# Patient Record
Sex: Female | Born: 1965 | Race: Black or African American | Hispanic: No | Smoking: Never smoker
Health system: Southern US, Community
[De-identification: ages and names within clinical notes are randomized; demographics above are authoritative.]

## PROBLEM LIST (undated history)

## (undated) DIAGNOSIS — R2681 Unsteadiness on feet: Secondary | ICD-10-CM

## (undated) DIAGNOSIS — F419 Anxiety disorder, unspecified: Secondary | ICD-10-CM

## (undated) DIAGNOSIS — Z8619 Personal history of other infectious and parasitic diseases: Secondary | ICD-10-CM

## (undated) DIAGNOSIS — E0590 Thyrotoxicosis, unspecified without thyrotoxic crisis or storm: Secondary | ICD-10-CM

## (undated) DIAGNOSIS — R002 Palpitations: Secondary | ICD-10-CM

## (undated) DIAGNOSIS — K219 Gastro-esophageal reflux disease without esophagitis: Secondary | ICD-10-CM

## (undated) DIAGNOSIS — I83819 Varicose veins of unspecified lower extremities with pain: Secondary | ICD-10-CM

## (undated) HISTORY — DX: Thyrotoxicosis, unspecified without thyrotoxic crisis or storm: E05.90

## (undated) HISTORY — DX: Varicose veins of unspecified lower extremity with pain: I83.819

## (undated) HISTORY — DX: Personal history of other infectious and parasitic diseases: Z86.19

## (undated) HISTORY — DX: Palpitations: R00.2

## (undated) HISTORY — DX: Anxiety disorder, unspecified: F41.9

## (undated) HISTORY — PX: NO PAST SURGERIES: SHX2092

## (undated) HISTORY — DX: Unsteadiness on feet: R26.81

## (undated) HISTORY — DX: Gastro-esophageal reflux disease without esophagitis: K21.9

---

## 2013-03-04 ENCOUNTER — Encounter: Payer: Self-pay | Admitting: Obstetrics and Gynecology

## 2013-03-24 ENCOUNTER — Encounter: Payer: Self-pay | Admitting: Obstetrics and Gynecology

## 2013-04-05 ENCOUNTER — Encounter: Payer: Self-pay | Admitting: Obstetrics and Gynecology

## 2014-04-01 DIAGNOSIS — I83819 Varicose veins of unspecified lower extremities with pain: Secondary | ICD-10-CM

## 2014-04-01 HISTORY — DX: Varicose veins of unspecified lower extremity with pain: I83.819

## 2014-04-22 DIAGNOSIS — L84 Corns and callosities: Secondary | ICD-10-CM | POA: Insufficient documentation

## 2015-09-27 DIAGNOSIS — K219 Gastro-esophageal reflux disease without esophagitis: Secondary | ICD-10-CM

## 2015-09-27 HISTORY — DX: Gastro-esophageal reflux disease without esophagitis: K21.9

## 2017-11-12 DIAGNOSIS — H2513 Age-related nuclear cataract, bilateral: Secondary | ICD-10-CM | POA: Diagnosis not present

## 2017-11-12 DIAGNOSIS — H40033 Anatomical narrow angle, bilateral: Secondary | ICD-10-CM | POA: Diagnosis not present

## 2017-12-30 DIAGNOSIS — R2681 Unsteadiness on feet: Secondary | ICD-10-CM | POA: Insufficient documentation

## 2017-12-30 DIAGNOSIS — R42 Dizziness and giddiness: Secondary | ICD-10-CM | POA: Diagnosis not present

## 2018-01-15 ENCOUNTER — Ambulatory Visit: Payer: Medicaid Other | Attending: Family Medicine | Admitting: Rehabilitative and Restorative Service Providers"

## 2018-01-15 ENCOUNTER — Encounter: Payer: Self-pay | Admitting: Rehabilitative and Restorative Service Providers"

## 2018-01-15 DIAGNOSIS — R2689 Other abnormalities of gait and mobility: Secondary | ICD-10-CM | POA: Diagnosis present

## 2018-01-15 DIAGNOSIS — R42 Dizziness and giddiness: Secondary | ICD-10-CM | POA: Insufficient documentation

## 2018-01-15 DIAGNOSIS — H8111 Benign paroxysmal vertigo, right ear: Secondary | ICD-10-CM | POA: Diagnosis present

## 2018-01-15 NOTE — Therapy (Signed)
[facility] [address] [place], [place], [address] Phone: [phone]   Fax:  [phone]  Physical Therapy Evaluation  Patient Details  Name: Melinda Charles MRN: [id number] Date of Birth: 03/12/1956 Referring Provider: Virl Son, MD   Encounter Date: 01/15/2018  PT End of Session - 01/15/18 1527    Visit Number  1    Number of Visits  4 eval + 3 visits    Date for PT Re-Evaluation  02/22/18    Authorization Type  Medicaid- PT to submit for 3 visits over next month (1x/week) 6/6-6/30/19    PT Start Time  1020    PT Stop Time  1100    PT Time Calculation (min)  40 min    Activity Tolerance  Patient tolerated treatment well    Behavior During Therapy  [facility] for tasks assessed/performed       History reviewed. No pertinent past medical history.  History reviewed. No pertinent surgical history.  There were no vitals filed for this visit.   Subjective Assessment - 01/15/18 1024    Subjective  The patient notes a correlation in onset of symptoms between getting new glasses and dizziness, approximately 1 month ago.  She notes she got glasses and then developed a room spinning sensation with right rolling.  Symptoms have improved because she is no longer wearing the glasses, however is "not back to normal".  She notes she has never has these issues before.   She notes she sometimes gets a neck spasm with dizziness.  She also notes head pain in the posterior aspect of her head.     Pertinent History  Denies h/o migraines.      Patient Stated Goals  "I'm trying to get rid of the imbalance, lightheadedness".  Some days are better than others.    Currently in Pain?  Yes    Pain Score  -- "tension", mild in head/neck    Pain Location  Neck    Pain Descriptors / Indicators  -- tension    Pain Type  Acute pain    Pain Onset  More than a month ago    Pain Frequency  Intermittent         [facility] PT Assessment - 01/15/18 1028       Assessment   Medical Diagnosis  BPPV, right ear    Referring Provider  Virl Son, MD    Onset Date/Surgical Date  -- order date=01/08/18, began approximately 1 month ago    Prior Therapy  none      Precautions   Precautions  None      Restrictions   Weight Bearing Restrictions  No      Balance Screen   Has the patient fallen in the past 6 months  No    Has the patient had a decrease in activity level because of a fear of falling?   No    Is the patient reluctant to leave their home because of a fear of falling?   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           Vestibular Assessment - 01/15/18 1030      Vestibular Assessment   General Observation  Patient walks into clinic independently without device.      Symptom Behavior   Type of Dizziness  Imbalance lightheadedness    Frequency of Dizziness  daily    Duration of Dizziness  depends on movement    Aggravating Factors  Activity in general    Relieving Factors  Head stationary      Occulomotor Exam   Occulomotor Alignment  Normal    Spontaneous  Absent    Gaze-induced  Absent    Smooth Pursuits  Intact    Saccades  Intact      Vestibulo-Occular Reflex   VOR 1 Head Only (x 1 viewing)  Head impulse test=intact bilaterally    VOR to Slow Head Movement  -- feels "whoozy" sensation x 5 reps     Comment  "general whoozy feeling"       Visual Acuity   Static  line 5 mistakes "F" for "R" in line 8,7,6    Dynamic  line 2 3 line difference indicates VOR abnormality      Positional Testing   Dix-Hallpike  Dix-Hallpike Right;Dix-Hallpike Left    Sidelying Test  Sidelying Right;Sidelying Left    Horizontal Canal Testing  Horizontal Canal Right;Horizontal Canal Left      Dix-Hallpike Right   Dix-Hallpike Right Duration  trace sensation of dizziness described as "imbalance" more than spinning    Dix-Hallpike Right Symptoms  No nystagmus viewed in room light      Dix-Hallpike Left   Dix-Hallpike Left Duration  mild sensation of dizziness    Dix-Hallpike Left Symptoms  No nystagmus      Sidelying Right   Sidelying Right Duration  none upon R sidelying, "imbalance" senation with return to sitting    Sidelying Right Symptoms  No nystagmus      Sidelying Left   Sidelying Left Duration  none    Sidelying Left Symptoms  No nystagmus      Horizontal Canal Right   Horizontal Canal Right Duration  none    Horizontal Canal Right Symptoms  Normal      Horizontal Canal Left   Horizontal Canal Left Duration  none    Horizontal Canal Left Symptoms  Normal      Positional Sensitivities   Sit to Supine  Lightheadedness    Supine to Left Side  No dizziness    Supine to Right Side  No dizziness    Supine to Sitting  Moderate dizziness    Right Hallpike  Lightheadedness    Up from Right Hallpike  Mild dizziness    Up from Left Hallpike  Moderate dizziness    Head Turning x 5  No dizziness    Head Nodding x 5  Lightheadedness    Rolling Right  No dizziness    Rolling Left  No dizziness    Positional Sensitivities Comments  *patient notes a mild headache after testing          Objective measurements  completed on examination: See above findings.       Vestibular Treatment/Exercise - 01/15/18 1049      Vestibular Treatment/Exercise   Vestibular Treatment Provided  Habituation     Habituation Exercises  Francee Piccolo Daroff;Horizontal Roll    Gaze Exercises  X1 Viewing Horizontal      Austin Miles   Number of Reps   1    Symptom Description   discussed performing for HEP until 2 days without symptoms.      Horizontal Roll   Number of Reps   3      X1 Viewing Horizontal   Foot Position  standing feet apart            PT Education - 01/15/18 1526    Education provided  Yes    Education Details  HEP: gaze x 1 viewing, habituation (rolling and brandt daroff)    Person(s) Educated  Patient    Methods  Explanation;Demonstration;Handout    Comprehension  Returned demonstration;Verbalized understanding          PT Long Term Goals - 01/15/18 1532      PT LONG TERM GOAL #1   Title  The patient will be indep with HEP for motion sensitivity, gaze x 1 adaptation, and high level balance.    Baseline  No current HEP.    Time  4    Period  Weeks    Target Date  02/14/18      PT LONG TERM GOAL #2   Title  The patient will improve SVA versus DVA to 2 line difference.    Baseline  3 line difference at baseline    Time  4    Period  Weeks    Target Date  02/14/18      PT LONG TERM GOAL #3   Title  The patient will subjectively report no dizziness with rolling in bed in morning or with quick transitional movements.    Baseline  Notes intermittent dizziness and rolling with bed mobility rated "moderate"    Time  4    Period  Weeks    Target Date  02/14/18      PT LONG TERM GOAL #4   Title  The patient will subjectively report episodes of neck spasms resolved as tolerance to overall movement improves.    Baseline  Notes neck spasms and has en bloc/guarded movement    Time  4    Period  Weeks    Target Date  02/14/18             Plan - 01/15/18 1534    Clinical Impression Statement  The patient is a 51 year old female presenting to OP physical therapy after an onset of room spinning vertigo 1 month ago with right rolling.  She feels this initial  episode has lessened, however she continues with general imbalance, dizziness and guarded movement.  This has led to muscle spasms in her neck and a general decline in household activities.  PT to address impairments in gaze adaptation, balance, motion sensitivity to optimize current functional status.    History and Personal Factors relevant to plan of care:  No other medical history, recent change in lenses    Clinical Presentation  Stable    Clinical Presentation due to:  improving symptoms since onset, general dizziness and imbalance remaining    Clinical Decision Making  Low    Rehab Potential  Good    PT Frequency  1x / week eval +  PT Duration  3 weeks    PT Treatment/Interventions  ADLs/Self Care Home Management;Balance training;Neuromuscular re-education;Patient/family education;Functional mobility training;Gait training;Stair training;Therapeutic activities;Therapeutic exercise;Manual techniques;Vestibular;Canalith Repostioning    PT Next Visit Plan  PT established HEP (no treatment charge today due to [facility]).  Check HEP, progress by adding high level balance, dynamic gait, progressing gaze and habituation    Consulted and Agree with Plan of Care  Patient       Patient will benefit from skilled therapeutic intervention in order to improve the following deficits and impairments:  Abnormal gait, Decreased activity tolerance, Dizziness, Increased muscle spasms, Decreased balance, Pain  Visit Diagnosis: Dizziness and giddiness  BPPV (benign paroxysmal positional vertigo), right  Other abnormalities of gait and mobility     Problem List There are no active problems to display for this patient.   Americo Vallery, PT 01/15/2018, 3:38 PM  [facility] [address] [place], [place], [address] Phone: [phone]   Fax:  [phone]  Name: Shantaya Bluestone MRN: [id number] Date of Birth: 03/12/1956

## 2018-01-15 NOTE — Patient Instructions (Signed)
Habituation - Tip Card  1.The goal of habituation training is to assist in decreasing symptoms of vertigo, dizziness, or nausea provoked by specific head and body motions. 2.These exercises may initially increase symptoms; however, be persistent and work through symptoms. With repetition and time, the exercises will assist in reducing or eliminating symptoms. 3.Exercises should be stopped and discussed with the therapist if you experience any of the following: - Sudden change or fluctuation in hearing - New onset of ringing in the ears, or increase in current intensity - Any fluid discharge from the ear - Severe pain in neck or back - Extreme nausea  Copyright  VHI. All rights reserved.   Habituation - Rolling   With pillow under head, start on back. Roll to your right side.  Hold until dizziness stops, plus 20 seconds and then roll to the left side.  Hold until dizziness stops, plus 20 seconds.  Repeat sequence 5 times per session. Do 2 sessions per day.  Copyright  VHI. All rights reserved.          Habituation - Sit to Side-Lying   Sit on edge of bed. Lie down onto the right side and hold until dizziness stops, plus 20 seconds.  Return to sitting and wait until dizziness stops, plus 20 seconds.  Repeat to the left side. Repeat sequence 5 times per session. Do 2 sessions per day.  Copyright  VHI. All rights reserved.    Gaze Stabilization - Tip Card  1.Target must remain in focus, not blurry, and appear stationary while head is in motion. 2.Perform exercises with small head movements (45 to either side of midline). 3.Increase speed of head motion so long as target is in focus. 4.If you wear eyeglasses, be sure you can see target through lens (therapist will give specific instructions for bifocal / progressive lenses). 5.These exercises may provoke dizziness or nausea. Work through these symptoms. If too dizzy, slow head movement slightly. Rest between each  exercise. 6.Exercises demand concentration; avoid distractions. 7.For safety, perform standing exercises close to a counter, wall, corner, or next to someone.  Copyright  VHI. All rights reserved.   Gaze Stabilization - Standing Feet Apart   Feet shoulder width apart, keeping eyes on target on wall 3 feet away, tilt head down slightly and move head side to side for 30 seconds. Repeat while moving head up and down for 30 seconds. *Work up to tolerating 60 seconds, as able. Do 2-3 sessions per day.   Copyright  VHI. All rights reserved.    

## 2018-01-18 ENCOUNTER — Emergency Department (HOSPITAL_COMMUNITY)
Admission: EM | Admit: 2018-01-18 | Discharge: 2018-01-18 | Disposition: A | Discharge status: Home / Self Care | Payer: Medicaid Other | Attending: Emergency Medicine | Admitting: Emergency Medicine

## 2018-01-18 ENCOUNTER — Other Ambulatory Visit: Payer: Self-pay

## 2018-01-18 ENCOUNTER — Emergency Department (HOSPITAL_COMMUNITY): Payer: Medicaid Other

## 2018-01-18 ENCOUNTER — Encounter (HOSPITAL_COMMUNITY): Payer: Self-pay | Admitting: Emergency Medicine

## 2018-01-18 DIAGNOSIS — E876 Hypokalemia: Secondary | ICD-10-CM | POA: Insufficient documentation

## 2018-01-18 DIAGNOSIS — R002 Palpitations: Secondary | ICD-10-CM | POA: Insufficient documentation

## 2018-01-18 LAB — I-STAT BETA HCG BLOOD, ED (MC, WL, AP ONLY)

## 2018-01-18 LAB — CBC
HEMATOCRIT: 37.3 % (ref 36.0–46.0)
HEMOGLOBIN: 12.5 g/dL (ref 12.0–15.0)
MCH: 32.3 pg (ref 26.0–34.0)
MCHC: 33.5 g/dL (ref 30.0–36.0)
MCV: 96.4 fL (ref 78.0–100.0)
Platelets: 289 10*3/uL (ref 150–400)
RBC: 3.87 MIL/uL (ref 3.87–5.11)
RDW: 12.7 % (ref 11.5–15.5)
WBC: 5.6 10*3/uL (ref 4.0–10.5)

## 2018-01-18 LAB — I-STAT TROPONIN, ED: Troponin i, poc: 0 ng/mL (ref 0.00–0.08)

## 2018-01-18 LAB — BASIC METABOLIC PANEL
ANION GAP: 11 (ref 5–15)
BUN: 12 mg/dL (ref 6–20)
CHLORIDE: 106 mmol/L (ref 101–111)
CO2: 22 mmol/L (ref 22–32)
Calcium: 9.3 mg/dL (ref 8.9–10.3)
Creatinine, Ser: 0.72 mg/dL (ref 0.44–1.00)
GFR calc Af Amer: >60 mL/min (ref >60)
Glucose, Bld: 109 mg/dL — ABNORMAL HIGH (ref 65–99)
POTASSIUM: 3.4 mmol/L — AB (ref 3.5–5.1)
SODIUM: 139 mmol/L (ref 135–145)

## 2018-01-18 MED ORDER — POTASSIUM CHLORIDE CRYS ER 20 MEQ PO TBCR
40.0000 meq | EXTENDED_RELEASE_TABLET | Freq: Once | ORAL | Status: AC
  Administered 2018-01-18: 40 meq via ORAL
  Filled 2018-01-18: qty 2

## 2018-01-18 MED ORDER — POTASSIUM CHLORIDE CRYS ER 20 MEQ PO TBCR: 20.0000 meq | EXTENDED_RELEASE_TABLET | Freq: Two times a day (BID) | ORAL | 0 refills | Status: DC

## 2018-01-18 NOTE — ED Triage Notes (Signed)
Pt reports having palpitations that started tonight and woke her from sleep. Pt reports feeling as if insides were shaking. Pt dx with vertigo several weeks ago. Pt reports having pain in left arm, shoulder, and armpit that started today.

## 2018-01-18 NOTE — Discharge Instructions (Addendum)
Please make a follow-up appointment with the cardiologist.  Return to the emergency department if you are having worsening of your symptoms.

## 2018-01-18 NOTE — ED Notes (Signed)
ED Provider at bedside. 

## 2018-01-18 NOTE — ED Provider Notes (Signed)
[facility]-EMERGENCY DEPT Provider Note   [name]: [id number] Arrival date & time: 01/18/18  0326     History   Chief Complaint Chief Complaint  Patient presents with  . Palpitations    HPI Melinda Charles is a 52 y.o. female.  The history is provided by the patient.  She had an episode this evening of her heart racing.  And lasted about 1-2 minutes and then resolved.  No associated chest pain, dyspnea, nausea, diaphoresis.  She states that she has been having several things going on over the left past few weeks.  She had seen her PCP for an episode of vertigo.  She was then referred to ENT who diagnosed bronchitis and gave her a steroid injection.  She has multiple other complaints, but her main issue tonight is the episode of her heart racing.  She denies dizziness or lightheadedness.  History reviewed. No pertinent past medical history.  There are no active problems to display for this patient.   History reviewed. No pertinent surgical history.   OB History   None      Home Medications    Prior to Admission medications   Medication Sig Start Date End Date Taking? Authorizing Provider  ibuprofen (ADVIL,MOTRIN) 200 MG tablet Take 400 mg by mouth every 6 (six) hours as needed for headache, mild pain, moderate pain or cramping.   Yes [provider]  Magnesium Oxide -Mg Supplement 400 MG CAPS Take 400 mg by mouth at bedtime. 01/12/18  Yes [provider]    Family History History reviewed. No pertinent family history.  Social History Social History   Tobacco Use  . Smoking status: Never Smoker  . Smokeless tobacco: Never Used  Substance Use Topics  . Alcohol use: Never    Frequency: Never  . Drug use: Never     Allergies   Patient has no known allergies.   Review of Systems Review of Systems  All other systems reviewed and are negative.    Physical Exam Updated Vital Signs BP 116/70   Pulse 63   Temp 98.3  F (36.8 C) (Oral)   Resp 15   Ht  (1.651 m)   Wt 74.8 kg (165 lb)   SpO2 100%   BMI 27.46 kg/m   Physical Exam  Nursing note and vitals reviewed.  52 year old female, resting comfortably and in no acute distress. Vital signs are normal. Oxygen saturation is 100%, which is normal. Head is normocephalic and atraumatic. PERRLA, EOMI. Oropharynx is clear. Neck is nontender and supple without adenopathy or JVD. Back is nontender and there is no CVA tenderness. Lungs are clear without rales, wheezes, or rhonchi. Chest is nontender. Heart has regular rate and rhythm without murmur. Abdomen is soft, flat, nontender without masses or hepatosplenomegaly and peristalsis is normoactive. Extremities have no cyanosis or edema, full range of motion is present. Skin is warm and dry without rash. Neurologic: Mental status is normal, cranial nerves are intact, there are no motor or sensory deficits.  ED Treatments / Results  Labs (all labs ordered are listed, but only abnormal results are displayed) Labs Reviewed  BASIC METABOLIC PANEL - Abnormal; Notable for the following components:      Result Value   Potassium 3.4 (*)    Glucose, Bld 109 (*)    All other components within normal limits  CBC  I-STAT TROPONIN, ED  I-STAT BETA HCG BLOOD, ED ([facility], [facility], [facility] ONLY)    EKG EKG  Interpretation  Date/Time:  /26/19 1610       Dione Booze, MD 01/18/18 [phone]

## 2018-01-21 ENCOUNTER — Encounter (HOSPITAL_COMMUNITY): Payer: Self-pay

## 2018-01-21 ENCOUNTER — Emergency Department (HOSPITAL_COMMUNITY): Payer: Medicaid Other

## 2018-01-21 ENCOUNTER — Emergency Department (HOSPITAL_COMMUNITY)
Admission: EM | Admit: 2018-01-21 | Discharge: 2018-01-21 | Disposition: A | Discharge status: Home / Self Care | Payer: Medicaid Other | Attending: Emergency Medicine | Admitting: Emergency Medicine

## 2018-01-21 DIAGNOSIS — R252 Cramp and spasm: Secondary | ICD-10-CM | POA: Diagnosis present

## 2018-01-21 DIAGNOSIS — M62838 Other muscle spasm: Secondary | ICD-10-CM | POA: Insufficient documentation

## 2018-01-21 DIAGNOSIS — R001 Bradycardia, unspecified: Secondary | ICD-10-CM | POA: Diagnosis not present

## 2018-01-21 LAB — BASIC METABOLIC PANEL
ANION GAP: 9 (ref 5–15)
BUN: 12 mg/dL (ref 6–20)
CALCIUM: 9.3 mg/dL (ref 8.9–10.3)
CHLORIDE: 106 mmol/L (ref 101–111)
CO2: 23 mmol/L (ref 22–32)
Creatinine, Ser: 0.71 mg/dL (ref 0.44–1.00)
GFR calc Af Amer: >60 mL/min (ref >60)
GFR calc non Af Amer: >60 mL/min (ref >60)
GLUCOSE: 101 mg/dL — AB (ref 65–99)
Potassium: 4 mmol/L (ref 3.5–5.1)
Sodium: 138 mmol/L (ref 135–145)

## 2018-01-21 LAB — CBC
HEMATOCRIT: 37.5 % (ref 36.0–46.0)
HEMOGLOBIN: 12.3 g/dL (ref 12.0–15.0)
MCH: 31.5 pg (ref 26.0–34.0)
MCHC: 32.8 g/dL (ref 30.0–36.0)
MCV: 96.2 fL (ref 78.0–100.0)
Platelets: 312 10*3/uL (ref 150–400)
RBC: 3.9 MIL/uL (ref 3.87–5.11)
RDW: 12.7 % (ref 11.5–15.5)
WBC: 7.2 10*3/uL (ref 4.0–10.5)

## 2018-01-21 LAB — I-STAT TROPONIN, ED: TROPONIN I, POC: 0 ng/mL (ref 0.00–0.08)

## 2018-01-21 NOTE — ED Notes (Signed)
Pt alert x4 c/o heart papulation and lf arm and back muscle spasm. Which as started on Sunday and increased today. Normal heart sounds noted. Pt rated pain a 5 at this time. PA at the bedside I will continue to monitor.

## 2018-01-21 NOTE — ED Triage Notes (Signed)
Pt was diagnosed with hypokalemia over the weekend and she started taking potassium supplements Pt states that she's not having the palpitations anymore but she still feels very off

## 2018-01-21 NOTE — ED Provider Notes (Signed)
[facility]-EMERGENCY DEPT Provider Note   [name]: [id number] Arrival date & time: 01/21/18  0021     History   Chief Complaint Chief Complaint  Patient presents with  . low potassium    HPI Melinda Charles is a 52 y.o. female.  HPI   52 year old female presenting for evaluation of muscle spasm.  Patient was seen in the ED 3 days ago for heart palpitation.  Examination was documented to be fairly unremarkable.  Patient was noted that her potassium was a bit low at 3.4.  Patient was given potassium supplementation as well as recommendation to follow-up with her primary care provider.  She does have an appointment with her provider tomorrow.  She is here due to concerns of muscle spasms.  Patient mention she has had random muscle spasm for nearly a week, sometime affecting her shoulder, her neck, her left side of chest and sometimes her legs.  It is short lasting without any significant pain.  No associated lightheadedness, dizziness, chest pain, trouble breathing, abdominal pain or back pain.  She does not have any significant cardiac history including no tobacco abuse and no shocking history of heart problem.  No change in any medication.  She was concerned that the low potassium and the muscle spasm can potentially affecting her heart.  History reviewed. No pertinent past medical history.  There are no active problems to display for this patient.   History reviewed. No pertinent surgical history.   OB History   None      Home Medications    Prior to Admission medications   Medication Sig Start Date End Date Taking? Authorizing Provider  ibuprofen (ADVIL,MOTRIN) 200 MG tablet Take 400 mg by mouth every 6 (six) hours as needed for headache, mild pain, moderate pain or cramping.   Yes [provider]  Magnesium Oxide -Mg Supplement 400 MG CAPS Take 400 mg by mouth at bedtime. 01/12/18  Yes [provider]  potassium chloride SA  (K-DUR,KLOR-CON) 20 MEQ tablet Take 1 tablet (20 mEq total) by mouth 2 (two) times daily. 01/18/18   Dione Booze, MD    Family History History reviewed. No pertinent family history.  Social History Social History   Tobacco Use  . Smoking status: Never Smoker  . Smokeless tobacco: Never Used  Substance Use Topics  . Alcohol use: Never    Frequency: Never  . Drug use: Never     Allergies   Patient has no known allergies.   Review of Systems Review of Systems  All other systems reviewed and are negative.    Physical Exam Updated Vital Signs BP 125/77 (BP Location: Right Arm)   Pulse (!) 58   Temp 98.4 F (36.9 C) (Oral)   Resp 15   Ht  (1.651 m)   Wt 74.1 kg (163 lb 6.4 oz)   SpO2 100%   BMI 27.19 kg/m   Physical Exam  Constitutional: She is oriented to person, place, and time. She appears well-developed and well-nourished. No distress.  HENT:  Head: Atraumatic.  Eyes: Conjunctivae are normal.  Neck: Normal range of motion. Neck supple.  Cardiovascular: Normal rate, regular rhythm and intact distal pulses.  Pulmonary/Chest: Effort normal and breath sounds normal.  Abdominal: Bowel sounds are normal. She exhibits no distension. There is no tenderness.  Musculoskeletal: She exhibits no edema.  Neurological: She is alert and oriented to person, place, and time.  5 out of 5 strength all 4 extremities.  Skin: No rash  noted.  Psychiatric: She has a normal mood and affect.  Nursing note and vitals reviewed.    ED Treatments / Results  Labs (all labs ordered are listed, but only abnormal results are displayed) Labs Reviewed  BASIC METABOLIC PANEL - Abnormal; Notable for the following components:      Result Value   Glucose, Bld 101 (*)    All other components within normal limits  CBC  I-STAT TROPONIN, ED    EKG EKG Interpretation  Date/Time:  Wednesday Jan 21 2018 02:44:32 EDT Ventricular Rate:  74 PR Interval:    QRS Duration: 81 QT  Interval:  401 QTC Calculation: 445 R Axis:   73 Text Interpretation:  Sinus rhythm Borderline T wave abnormalities No significant change was found Confirmed by Paula Libra ([id number]) on 01/21/2018 2:57:59 AM   Radiology Dg Chest 2 View  Result Date: 01/21/2018 CLINICAL DATA:  Acute onset of palpitations. Hypokalemia. EXAM: CHEST - 2 VIEW COMPARISON:  Chest radiograph performed 01/18/2018 FINDINGS: The lungs are well-aerated and clear. There is no evidence of focal opacification, pleural effusion or pneumothorax. The heart is normal in size; the mediastinal contour is within normal limits. No acute osseous abnormalities are seen. IMPRESSION: No acute cardiopulmonary process seen. Electronically Signed   By: Roanna Raider M.D.   On: 01/21/2018 03:40    Procedures Procedures (including critical care time)  Medications Ordered in ED Medications - No data to display   Initial Impression / Assessment and Plan / ED Course  I have reviewed the triage vital signs and the nursing notes.  Pertinent labs & imaging results that were available during my care of the patient were reviewed by me and considered in my medical decision making (see chart for details).     BP 129/78   Pulse (!) 58   Temp 98.4 F (36.9 C) (Oral)   Resp 13   Ht  (1.651 m)   Wt 74.1 kg (163 lb 6.4 oz)   SpO2 100%   BMI 27.19 kg/m    Final Clinical Impressions(s) / ED Diagnoses   Final diagnoses:  Muscle spasm    ED Discharge Orders    None     6:16 AM Patient here with request to be evaluated for random muscle spasm for the past week.  No provocative factor.  Was seen 3 days ago for heart palpitation and was documented to have low potassium of 3.4.  Supplementation was given.  Her heart palpitation has since resolved but her muscle spasms still persist.  Muscle spasm started before her previous ER visit.  On exam, she has no focal weakness.  Labs are reassuring, potassium level has been corrected.  EKG  shows no significant changes.  Patient is not having significant risk factors for cardiac disease.  Vital signs stable.  At this time she will need to follow-up with her primary care provider for further evaluation of her condition.  She does have some mild bradycardia with heart rate of 58 documented but otherwise no other concerning feature.   Fayrene Helper, PA-C 01/21/18 6045    Paula Libra, MD 01/21/18 [phone]

## 2018-01-21 NOTE — ED Notes (Signed)
Pt will be d/c home with family.

## 2018-01-21 NOTE — Discharge Instructions (Signed)
Please follow up with your primary care provider tomorrow as previously scheduled.  You may take tylenol or ibuprofen as needed for aches and pain.  Eat food high in potassium content such as banana or green leafy vegetable.

## 2018-01-22 DIAGNOSIS — R002 Palpitations: Secondary | ICD-10-CM | POA: Insufficient documentation

## 2018-01-27 NOTE — Progress Notes (Deleted)
Cardiology Office Note:    Date:  01/27/2018   ID:  Melinda Charles, DOB 1965/09/22, MRN [id number]  PCP:  Melinda Charles, Melinda Lamonica, MD  Cardiologist:  Norman HerrlichBrian Munley, MD   Referring MD: Melinda Charles, Melinda Lamonica, MD  ASSESSMENT:    No diagnosis found. PLAN:    In order of problems listed above:  1. ***  Next appointment   Medication Adjustments/Labs and Tests Ordered: Current medicines are reviewed at length with the patient today.  Concerns regarding medicines are outlined above.  No orders of the defined types were placed in this encounter.  No orders of the defined types were placed in this encounter.    No chief complaint on file. ***  History of Present Illness:    Melinda Charles is a 52 y.o. female who is being seen today for the evaluation of palpitation at the request of Melinda Charles, Melinda Lamonica, MD. [facility]: Initial Impression / Assessment and Plan / ED Course  I have reviewed the triage vital signs and the nursing notes. Pertinent labs & imaging results that were available during my care of the patient were reviewed by me and considered in my medical decision making (see chart for details). Palpitations.  ECG shows sinus arrhythmia.  Laboratory work-up shows mild hypokalemia, which could contribute to cardiac arrhythmias.  She is given a dose of oral potassium and she will be sent home with a prescription for oral potassium.  Chest x-ray is unremarkable.  She will need a Holter monitor or event monitor to identify what kind of arrhythmia she is having, if any.  She is referred to cardiology for outpatient follow-up.  Old records are reviewed, confirming recent office visit for vertigo, but no other relevant past visits.  K 4.0  EKG SRTH nonspecific T waves  Final Clinical Impressions(s) / ED Diagnoses   Final diagnoses:  Palpitations  Hypokalemia     Past Medical History:  Diagnosis Date  . Gastroesophageal reflux disease without  esophagitis 09/27/2015   Last Assessment & Plan:  2 month history of symptoms Supplies sent to obtain stool sample for H pylori test Trial H2blocker or PPI/reflux precautions Last Assessment & Plan:  2 month history of symptoms Supplies sent to obtain stool sample for H pylori test Trial H2blocker or PPI/reflux precautions  . Palpitations 01/22/2018  . Unsteadiness 12/30/2017   Last Assessment & Plan:  Concern over unsteadiness. 2-week history of unsteady sensation.  Almost constant.  Started with significant spinning vertigo but those episodes have resolved.  Denies sudden change in hearing.  No prior history of similar symptoms. EXAM shows normal external canals and tympanic membranes bilaterally.  Hallpike maneuver produced no vertigo with either ear down. PLAN: It is  . Varicose veins of leg with pain 04/01/2014   Last Assessment & Plan:  Generalized achiness related to bilateral varicose veins.  Discussed support stockings, extremity elevation, both of which she has done in the past.  She is interested in procedures to alleviate the pain. Last Assessment & Plan:  Generalized achiness related to bilateral varicose veins.  Discussed support stockings, extremity elevation, both of which she has done in the pa    No past surgical history on file.  Current Medications: No outpatient medications have been marked as taking for the 01/28/18 encounter (Appointment) with Baldo DaubMunley, Brian J, MD.     Allergies:   Patient has no known allergies.   Social History   Socioeconomic History  . Marital status: Single  Spouse name: Not on file  . Number of children: Not on file  . Years of education: Not on file  . Highest education level: Not on file  Occupational History  . Not on file  Social Needs  . Financial resource strain: Not on file  . Food insecurity:    Worry: Not on file    Inability: Not on file  . Transportation needs:    Medical: Not on file    Non-medical: Not on file  Tobacco Use  .  Smoking status: Never Smoker  . Smokeless tobacco: Never Used  Substance and Sexual Activity  . Alcohol use: Never    Frequency: Never  . Drug use: Never  . Sexual activity: Not on file  Lifestyle  . Physical activity:    Days per week: Not on file    Minutes per session: Not on file  . Stress: Not on file  Relationships  . Social connections:    Talks on phone: Not on file    Gets together: Not on file    Attends religious service: Not on file    Active member of club or organization: Not on file    Attends meetings of clubs or organizations: Not on file    Relationship status: Not on file  Other Topics Concern  . Not on file  Social History Narrative  . Not on file     Family History: The patient's ***family history is not on file.  ROS:   ROS Please see the history of present illness.    *** All other systems reviewed and are negative.  EKGs/Labs/Other Studies Reviewed:    The following studies were reviewed today: ***  EKG:  EKG is *** ordered today.  The ekg ordered today demonstrates ***  Recent Labs: 01/21/2018: BUN 12; Creatinine, Ser 0.71; Hemoglobin 12.3; Platelets 312; Potassium 4.0; Sodium 138  Recent Lipid Panel No results found for: CHOL, TRIG, HDL, CHOLHDL, VLDL, LDLCALC, LDLDIRECT  Physical Exam:    VS:  There were no vitals taken for this visit.    Wt Readings from Last 3 Encounters:  01/21/18 163 lb 6.4 oz (74.1 kg)  01/18/18 165 lb (74.8 kg)     GEN: *** Well nourished, well developed in no acute distress HEENT: Normal NECK: No JVD; No carotid bruits LYMPHATICS: No lymphadenopathy CARDIAC: ***RRR, no murmurs, rubs, gallops RESPIRATORY:  Clear to auscultation without rales, wheezing or rhonchi  ABDOMEN: Soft, non-tender, non-distended MUSCULOSKELETAL:  No edema; No deformity  SKIN: Warm and dry NEUROLOGIC:  Alert and oriented x 3 PSYCHIATRIC:  Normal affect     Signed, Norman Herrlich, MD  01/27/2018 1:31 PM    [facility]

## 2018-01-28 ENCOUNTER — Ambulatory Visit: Payer: Medicaid Other | Admitting: Cardiology

## 2018-01-28 ENCOUNTER — Encounter: Payer: Self-pay | Admitting: Gastroenterology

## 2018-01-29 ENCOUNTER — Encounter: Payer: Self-pay | Admitting: Cardiology

## 2018-01-29 ENCOUNTER — Ambulatory Visit: Payer: Medicaid Other

## 2018-02-11 ENCOUNTER — Encounter

## 2018-02-11 ENCOUNTER — Other Ambulatory Visit: Payer: Medicaid Other

## 2018-02-11 ENCOUNTER — Encounter: Payer: Self-pay | Admitting: Gastroenterology

## 2018-02-11 ENCOUNTER — Ambulatory Visit: Payer: Medicaid Other | Admitting: Gastroenterology

## 2018-02-11 VITALS — BP 108/60 | HR 79 | Ht 65.0 in | Wt 162.0 lb

## 2018-02-11 DIAGNOSIS — Z8619 Personal history of other infectious and parasitic diseases: Secondary | ICD-10-CM | POA: Diagnosis not present

## 2018-02-11 DIAGNOSIS — Z1211 Encounter for screening for malignant neoplasm of colon: Secondary | ICD-10-CM | POA: Diagnosis not present

## 2018-02-11 MED ORDER — NA SULFATE-K SULFATE-MG SULF 17.5-3.13-1.6 GM/177ML PO SOLN: 1.0000 | ORAL | 0 refills | Status: DC

## 2018-02-11 NOTE — Progress Notes (Signed)
02/11/2018 Melinda Charles [id number] 04-08-1966   HISTORY OF PRESENT ILLNESS: This is a pleasant 52 year old female who is new to our office.  She presents here today with complaints of epigastric abdominal pain, belching, bloating, sensation of heartburn or reflux.  She describes a sensation of bloating and fluttering her upper abdomen.  She also complains of a constellation of other non-GI symptoms including muscle cramping, vertigo, and anxiety.  She says that all the symptoms initially started 3 months ago and some of them have developed since that time.  She tells me that the other day when she ate salmon she got severe muscle cramping.  She does not really like to take medication.  She has never had a colonoscopy in the past.  She says that she moves her bowels well without any issues.  She denies seeing blood in her stools or black stools.  Recent CBC and BMP are unremarkable.   Past Medical History:  Diagnosis Date  . Gastroesophageal reflux disease without esophagitis 09/27/2015   Last Assessment & Plan:  2 month history of symptoms Supplies sent to obtain stool sample for H pylori test Trial H2blocker or PPI/reflux precautions Last Assessment & Plan:  2 month history of symptoms Supplies sent to obtain stool sample for H pylori test Trial H2blocker or PPI/reflux precautions  . Palpitations 01/22/2018  . Unsteadiness 12/30/2017   Last Assessment & Plan:  Concern over unsteadiness. 2-week history of unsteady sensation.  Almost constant.  Started with significant spinning vertigo but those episodes have resolved.  Denies sudden change in hearing.  No prior history of similar symptoms. EXAM shows normal external canals and tympanic membranes bilaterally.  Hallpike maneuver produced no vertigo with either ear down. PLAN: It is  . Varicose veins of leg with pain 04/01/2014   Last Assessment & Plan:  Generalized achiness related to bilateral varicose veins.  Discussed support stockings,  extremity elevation, both of which she has done in the past.  She is interested in procedures to alleviate the pain. Last Assessment & Plan:  Generalized achiness related to bilateral varicose veins.  Discussed support stockings, extremity elevation, both of which she has done in the pa   History reviewed. No pertinent surgical history.  reports that she has never smoked. She has never used smokeless tobacco. She reports that she does not drink alcohol or use drugs. family history includes Hypertension in her mother. No Known Allergies    Outpatient Encounter Medications as of 02/11/2018  Medication Sig  . acetaminophen (TYLENOL) 325 MG tablet Take 650 mg by mouth every 6 (six) hours as needed.  . [DISCONTINUED] ibuprofen (ADVIL,MOTRIN) 200 MG tablet Take 400 mg by mouth every 6 (six) hours as needed for headache, mild pain, moderate pain or cramping.  . [DISCONTINUED] Magnesium Oxide -Mg Supplement 400 MG CAPS Take 400 mg by mouth at bedtime.  . [DISCONTINUED] potassium chloride SA (K-DUR,KLOR-CON) 20 MEQ tablet Take 1 tablet (20 mEq total) by mouth 2 (two) times daily.   No facility-administered encounter medications on file as of 02/11/2018.      REVIEW OF SYSTEMS  : All other systems reviewed and negative except where noted in the History of Present Illness.   PHYSICAL EXAM: BP 108/60   Pulse 79   Ht 5\' 5"  (1.651 m)   Wt 162 lb (73.5 kg)   BMI 26.96 kg/m  General: Well developed white female in no acute distress Head: Normocephalic and atraumatic Eyes:  Sclerae anicteric, conjunctiva pink. Ears:  Normal auditory acuity Lungs: Clear throughout to auscultation; no increased WOB. Heart: Regular rate and rhythm; no M/R/G. Abdomen: Soft, non-distended.  BS present.  Non-tender. Rectal:  Will be done at the time of colonoscopy. Musculoskeletal: Symmetrical with no gross deformities  Skin: No lesions on visible extremities Extremities: No edema  Neurological: Alert oriented x 4,  grossly non-focal Psychological:  Alert and cooperative. Normal mood and affect  ASSESSMENT AND PLAN: *Epigastric abdominal pain and bloating: Reports that symptoms feel similar as when she had H. pylori previously.  Will check H. pylori stool antigen.  May need to consider trial of H2 blocker or PPI, but will hold off for now as patient does not like to take medication. *Screening colonoscopy:  Will schedule with Dr. Lavon Paganini.  **Of note, she has a constellation of non-GI symptoms such as muscle cramping, vertigo, anxiety.  I am not sure if all of her symptoms are related to her anxiety, but these issues will likely need to be addressed further by her PCP.  **The risks, benefits, and alternatives to colonoscopy were discussed with the patient and she consents to proceed.   CC:  Verlon Au, MD

## 2018-02-11 NOTE — Patient Instructions (Addendum)
You have been scheduled for a colonoscopy. Please follow written instructions given to you at your visit today.  Please pick up your prep supplies at the pharmacy within the next 1-3 days. If you use inhalers (even only as needed), please bring them with you on the day of your procedure. Your physician has requested that you go to www.startemmi.com and enter the access code given to you at your visit today. This web site gives a general overview about your procedure. However, you should still follow specific instructions given to you by our office regarding your preparation for the procedure.   Your provider has requested that you go to the basement level for lab work before leaving today. Press "B" on the elevator. The lab is located at the first door on the left as you exit the elevator.   

## 2018-02-12 ENCOUNTER — Encounter: Payer: Medicaid Other | Admitting: Rehabilitative and Restorative Service Providers"

## 2018-02-12 ENCOUNTER — Telehealth: Payer: Self-pay | Admitting: Gastroenterology

## 2018-02-12 LAB — HELICOBACTER PYLORI  SPECIAL ANTIGEN
MICRO NUMBER:: 90734397
SPECIMEN QUALITY: ADEQUATE

## 2018-02-12 NOTE — Telephone Encounter (Signed)
Spoke with pt and let her know the results are not back yet. 

## 2018-02-13 ENCOUNTER — Encounter: Payer: Self-pay | Admitting: Rehabilitative and Restorative Service Providers"

## 2018-02-13 NOTE — Therapy (Signed)
[facility] [address] [place], [place], [address] Phone: [phone]   Fax:  [phone]  Patient Details  Name: Melinda Charles MRN: [id number] Date of Birth: [birth year] Referring Provider:  Virl Sonammy Boyd, MD  Encounter Date: last encounter 01/15/2018  PHYSICAL THERAPY DISCHARGE SUMMARY  Visits from Start of Care: eval only  Current functional level related to goals / functional outcomes:  Patient did not return to PT.  Please refer to initial evaluation for patient status. Thank you for the referral of this patient. Margretta Dittyhristina Reford Olliff, MPT  Melinda Charles 02/13/2018, 9:13 AM  [facility] [address] [place], [place], [address] Phone: [phone]   Fax:  [phone]

## 2018-02-16 NOTE — Progress Notes (Signed)
Reviewed and agree with documentation and assessment and plan. K. Veena Nandigam , MD   

## 2018-03-09 ENCOUNTER — Other Ambulatory Visit: Payer: Self-pay

## 2018-03-09 ENCOUNTER — Encounter: Payer: Self-pay | Admitting: Physical Therapy

## 2018-03-09 ENCOUNTER — Ambulatory Visit: Payer: Medicaid Other | Attending: Family Medicine | Admitting: Physical Therapy

## 2018-03-09 DIAGNOSIS — M542 Cervicalgia: Secondary | ICD-10-CM | POA: Diagnosis not present

## 2018-03-09 NOTE — Therapy (Signed)
[facility] [address] [place], [place], [address] Phone: [phone]   Fax:  [phone]  Physical Therapy Evaluation  Patient Details  Name: Melinda Charles MRN: [id number] Date of Birth: 1966-02-16 Referring Provider: Virl Sonammy Boyd   Encounter Date: 03/09/2018  PT End of Session - 03/09/18 1610    Visit Number  1    Number of Visits  4    Date for PT Re-Evaluation  04/19/18    Authorization Type  Medicaid- PT to submit for 3 visits over next month (1x/week)     PT Start Time  1530    PT Stop Time  1620    PT Time Calculation (min)  50 min    Activity Tolerance  Patient tolerated treatment well    Behavior During Therapy  [facility] for tasks assessed/performed       Past Medical History:  Diagnosis Date  . Gastroesophageal reflux disease without esophagitis 09/27/2015   Last Assessment & Plan:  2 month history of symptoms Supplies sent to obtain stool sample for H pylori test Trial H2blocker or PPI/reflux precautions Last Assessment & Plan:  2 month history of symptoms Supplies sent to obtain stool sample for H pylori test Trial H2blocker or PPI/reflux precautions  . Palpitations 01/22/2018  . Unsteadiness 12/30/2017   Last Assessment & Plan:  Concern over unsteadiness. 2-week history of unsteady sensation.  Almost constant.  Started with significant spinning vertigo but those episodes have resolved.  Denies sudden change in hearing.  No prior history of similar symptoms. EXAM shows normal external canals and tympanic membranes bilaterally.  Hallpike maneuver produced no vertigo with either ear down. PLAN: It is  . Varicose veins of leg with pain 04/01/2014   Last Assessment & Plan:  Generalized achiness related to bilateral varicose veins.  Discussed support stockings, extremity elevation, both of which she has done in the past.  She is interested in procedures to alleviate the pain. Last Assessment & Plan:  Generalized  achiness related to bilateral varicose veins.  Discussed support stockings, extremity elevation, both of which she has done in the pa    History reviewed. No pertinent surgical history.  There were no vitals filed for this visit.   Subjective Assessment - 03/09/18 1539    Subjective  Patient reports that she started with some vertigo issues in May, then reports about a month later she was having neck spasms.  Then a few weeks later she started having chest pain, EKG was negative, the ED MD reported maybe costochondritis.  Was seeing a chiropractor, but reports that she stopped due to making it worse.    Limitations  Sitting;Lifting;House hold activities    Patient Stated Goals  have less pain    Currently in Pain?  Yes    Pain Score  1     Pain Location  Neck and chest    Pain Orientation  Right;Left    Pain Descriptors / Indicators  Spasm;Cramping    Pain Type  Acute pain    Pain Onset  More than a month ago    Pain Frequency  Intermittent    Aggravating Factors   has a 51 year old special needs son, 678/10. reports stress increases the pain    Pain Relieving Factors  rest, pain over the past week has been much less pain    Effect of Pain on Daily Activities  when hurting really limits everything  [facility] PT Assessment - 03/09/18 0001      Assessment   Medical Diagnosis  Neck pain, chest pain    Referring Provider  Tammy Boyd    Onset Date/Surgical Date  02/07/18    Prior Therapy  for vertigo      Precautions   Precautions  None      Balance Screen   Has the patient fallen in the past 6 months  No    Has the patient had a decrease in activity level because of a fear of falling?   No    Is the patient reluctant to leave their home because of a fear of falling?   No      Home Environment   Additional Comments  special needs child that is 72 years old, does her own housework , does some yardowork      Prior Function   Level of Independence  Independent    Vocation   Unemployed    Leisure  sometimes going to the gym reports mostly cardio      Posture/Postural Control   Posture Comments  fwd head, rounded shoulders      ROM / Strength   AROM / PROM / Strength  AROM;Strength      AROM   Overall AROM Comments  Shoulder AROM flexion 150, IR was to 50 degrees, Her cervical ROM was decreased 50% with c/o pain in the neck and the upper trap      Strength   Overall Strength Comments  UE strength 4-/5 with some pain      Palpation   Palpation comment  she is very tight in the upper traps, the cervical area and was very tender to palpation in the pectoral area      Special Tests   Other special tests  resisted breathing no increased pain                Objective measurements completed on examination: See above findings.      [facility] Adult PT Treatment/Exercise - 03/09/18 0001      Modalities   Modalities  Electrical Stimulation;Moist Heat      Moist Heat Therapy   Number Minutes Moist Heat  15 Minutes    Moist Heat Location  Cervical      Electrical Stimulation   Electrical Stimulation Location  cervical area    Electrical Stimulation Action  IFC    Electrical Stimulation Parameters  supine    Electrical Stimulation Goals  Pain             PT Education - 03/09/18 1609    Education provided  Yes    Education Details  HEP for shoulder shrugs, upper trap stretches, cervical and scapular retractions    Person(s) Educated  Patient    Methods  Explanation;Demonstration;Handout    Comprehension  Verbalized understanding          PT Long Term Goals - 03/09/18 1615      PT LONG TERM GOAL #1   Title  independent with HEP    Time  4    Period  Weeks    Status  New      PT LONG TERM GOAL #2   Title  decrease pain 50%    Time  8    Period  Weeks    Status  New      PT LONG TERM GOAL #3   Title  increase cervical ROM 25%    Time  8  Period  Weeks    Status  New      PT LONG TERM GOAL #4   Title  report back up  care without difficulty    Time  8    Period  Weeks    Status  New             Plan - 03/09/18 1611    Clinical Impression Statement  Patient reports a spell of vertigo in May.  She reports that about 3 weeks later she started having neck pain, then started having chest pains, she went to the ED for this, EKG was negative, MD felt like it was costochondritis.  Her cervical ROM was decreased 50%, her shoulders has some limitation.  She is very tight in the upper traps, the pecs and the cervical area.  Resisted breathing was negative.    Clinical Presentation  Evolving    Clinical Decision Making  Low    Rehab Potential  Good    PT Frequency  1x / week    PT Duration  4 weeks    PT Treatment/Interventions  ADLs/Self Care Home Management;Balance training;Neuromuscular re-education;Patient/family education;Functional mobility training;Gait training;Stair training;Therapeutic activities;Therapeutic exercise;Manual techniques;Canalith Repostioning;Electrical Stimulation;Moist Heat;Traction;Vestibular;Visual/perceptual remediation/compensation    PT Next Visit Plan  try to give advanced HEP    Consulted and Agree with Plan of Care  Patient       Patient will benefit from skilled therapeutic intervention in order to improve the following deficits and impairments:  Decreased range of motion, Increased fascial restricitons, Impaired UE functional use, Increased muscle spasms, Decreased activity tolerance, Pain, Improper body mechanics, Impaired flexibility, Decreased strength, Postural dysfunction  Visit Diagnosis: Cervicalgia - Plan: PT plan of care cert/re-cert     Problem List Patient Active Problem List   Diagnosis Date Noted  . Special screening for malignant neoplasms, colon 02/11/2018  . History of Helicobacter pylori infection 02/11/2018  . Palpitations 01/22/2018  . Unsteadiness 12/30/2017  . Gastroesophageal reflux disease without esophagitis 09/27/2015  . Varicose veins of  leg with pain 04/01/2014    Jearld Lesch., PT 03/09/2018, 6:00 PM  [facility] [address] [place], [place], [address] Phone: [phone]   Fax:  [phone]  Name: Melinda Charles MRN: [id number] Date of Birth: [birth year]

## 2018-03-18 ENCOUNTER — Encounter: Payer: Self-pay | Admitting: Physical Therapy

## 2018-03-18 ENCOUNTER — Ambulatory Visit: Payer: Medicaid Other | Admitting: Physical Therapy

## 2018-03-18 DIAGNOSIS — M542 Cervicalgia: Secondary | ICD-10-CM | POA: Diagnosis not present

## 2018-03-18 NOTE — Therapy (Addendum)
[place], [place], [address] Phone: [phone]   Fax:  [phone]  Physical Therapy Treatment  Patient Details  Name: Melinda Charles MRN: [id number] Date of Birth: [birth year] Referring Provider: Precious Haws   Encounter Date: 03/18/2018  PT End of Session - 03/18/18 1229    Visit Number  2    Number of Visits  4    Date for PT Re-Evaluation  04/19/18    Authorization Type  Medicaid- PT to submit for 3 visits over next month (1x/week)     PT Start Time  1149    PT Stop Time  1244    PT Time Calculation (min)  55 min    Activity Tolerance  Patient tolerated treatment well    Behavior During Therapy  [facility] for tasks assessed/performed       Past Medical History:  Diagnosis Date  . Gastroesophageal reflux disease without esophagitis 09/27/2015   Last Assessment & Plan:  2 month history of symptoms Supplies sent to obtain stool sample for H pylori test Trial H2blocker or PPI/reflux precautions Last Assessment & Plan:  2 month history of symptoms Supplies sent to obtain stool sample for H pylori test Trial H2blocker or PPI/reflux precautions  . Palpitations 01/22/2018  . Unsteadiness 12/30/2017   Last Assessment & Plan:  Concern over unsteadiness. 2-week history of unsteady sensation.  Almost constant.  Started with significant spinning vertigo but those episodes have resolved.  Denies sudden change in hearing.  No prior history of similar symptoms. EXAM shows normal external canals and tympanic membranes bilaterally.  Hallpike maneuver produced no vertigo with either ear down. PLAN: It is  . Varicose veins of leg with pain 04/01/2014   Last Assessment & Plan:  Generalized achiness related to bilateral varicose veins.  Discussed support stockings, extremity elevation, both of which she has done in the past.  She is interested in procedures to alleviate the pain. Last Assessment & Plan:  Generalized achiness  related to bilateral varicose veins.  Discussed support stockings, extremity elevation, both of which she has done in the pa    History reviewed. No pertinent surgical history.  There were no vitals filed for this visit.  Subjective Assessment - 03/18/18 1152    Subjective  Pt reports that's her R hand hurts like it was sprained 2 days ago having some pain and swelling. Today's she reports those symptoms have gone away    Currently in Pain?  Yes    Pain Score  5     Pain Location  Hand    Pain Orientation  Right                       [facility] Adult PT Treatment/Exercise - 03/18/18 0001      Exercises   Exercises  Neck      Neck Exercises: Machines for Strengthening   UBE (Upper Arm Bike)  L3 46fd/3rev       Neck Exercises: Standing   Other Standing Exercises  Rows & ext red tband 2x10    Other Standing Exercises  ER yellow Tband 2x10, Shruggs 5lb 2x10       Neck Exercises: Supine   Neck Retraction  10 reps;3 secs 3 way      Modalities   Modalities  Electrical Stimulation;Moist Heat      Moist Heat Therapy   Number Minutes Moist Heat  15 Minutes    Moist  Heat Location  Cervical      Electrical Stimulation   Electrical Stimulation Location  cervical area    Electrical Stimulation Action  I    Electrical Stimulation Parameters  supine    Electrical Stimulation Goals  Pain      Manual Therapy   Manual Therapy  Passive ROM;Neural Stretch;Soft tissue mobilization    Manual therapy comments  supine    Soft tissue mobilization  cervical paraspinales, UT, STM    Passive ROM  cervical spine    Neural Stretch  RUE median?ulnar nerve                  PT Long Term Goals - 03/09/18 1615      PT LONG TERM GOAL #1   Title  independent with HEP    Time  4    Period  Weeks    Status  New      PT LONG TERM GOAL #2   Title  decrease pain 50%    Time  8    Period  Weeks    Status  New      PT LONG TERM GOAL #3   Title  increase cervical ROM 25%     Time  8    Period  Weeks    Status  New      PT LONG TERM GOAL #4   Title  report back up care without difficulty    Time  8    Period  Weeks    Status  New            Plan - 03/18/18 1232    Clinical Impression Statement  Patient tolerated an initial progression to TE well. She reports more pain in her R wrist more so than her neck. All exercises performed well, she does reports she could feel it in her neck with Shoulder shrugs. Some tightness noted in her upper traps more os on the R. Reports compliance with HEP.    Rehab Potential  Good    PT Frequency  1x / week    PT Duration  4 weeks    PT Treatment/Interventions  ADLs/Self Care Home Management;Balance training;Neuromuscular re-education;Patient/family education;Functional mobility training;Gait training;Stair training;Therapeutic activities;Therapeutic exercise;Manual techniques;Canalith Repostioning;Electrical Stimulation;Moist Heat;Traction;Vestibular;Visual/perceptual remediation/compensation    PT Home Exercise Plan  Rows, Ext, cervical retractions       Patient will benefit from skilled therapeutic intervention in order to improve the following deficits and impairments:  Decreased range of motion, Increased fascial restricitons, Impaired UE functional use, Increased muscle spasms, Decreased activity tolerance, Pain, Improper body mechanics, Impaired flexibility, Decreased strength, Postural dysfunction  Visit Diagnosis: Cervicalgia     Problem List Patient Active Problem List   Diagnosis Date Noted  . Special screening for malignant neoplasms, colon 02/11/2018  . History of Helicobacter pylori infection 02/11/2018  . Palpitations 01/22/2018  . Unsteadiness 12/30/2017  . Gastroesophageal reflux disease without esophagitis 09/27/2015  . Varicose veins of leg with pain 04/01/2014   PHYSICAL THERAPY DISCHARGE SUMMARY  Visits from Start of Care: 2   Plan: Patient agrees to discharge.  Patient goals were  not met. Patient is being discharged due to not returning since the last visit.  ?????     Scot Jun, PTA 03/18/2018, 12:34 PM  [place], [place], [address] Phone: [phone]   Fax:  [phone]  Name: Naveen Lorusso MRN: [id number] Date of Birth: July 30, 1966

## 2018-03-24 ENCOUNTER — Ambulatory Visit: Payer: Medicaid Other | Admitting: Physical Therapy

## 2018-03-25 ENCOUNTER — Encounter: Payer: Medicaid Other | Admitting: Gastroenterology

## 2018-03-25 ENCOUNTER — Telehealth: Payer: Self-pay | Admitting: Gastroenterology

## 2018-03-25 NOTE — Telephone Encounter (Signed)
Ok to charge for no show.

## 2018-04-01 ENCOUNTER — Ambulatory Visit: Payer: Medicaid Other | Attending: Family Medicine | Admitting: Physical Therapy

## 2018-04-08 NOTE — Telephone Encounter (Signed)
Has Medicaid, unable to bill but will track future No Shows..Marland Kitchen

## 2018-04-23 DIAGNOSIS — R51 Headache: Secondary | ICD-10-CM | POA: Diagnosis not present

## 2018-04-23 DIAGNOSIS — R42 Dizziness and giddiness: Secondary | ICD-10-CM | POA: Diagnosis not present

## 2018-04-23 DIAGNOSIS — M542 Cervicalgia: Secondary | ICD-10-CM | POA: Diagnosis not present

## 2018-10-01 DIAGNOSIS — R202 Paresthesia of skin: Secondary | ICD-10-CM | POA: Diagnosis not present

## 2018-10-01 DIAGNOSIS — G253 Myoclonus: Secondary | ICD-10-CM | POA: Diagnosis not present

## 2018-10-14 DIAGNOSIS — Z1239 Encounter for other screening for malignant neoplasm of breast: Secondary | ICD-10-CM | POA: Diagnosis not present

## 2018-10-14 DIAGNOSIS — Z1231 Encounter for screening mammogram for malignant neoplasm of breast: Secondary | ICD-10-CM | POA: Diagnosis not present

## 2018-10-23 DIAGNOSIS — E0590 Thyrotoxicosis, unspecified without thyrotoxic crisis or storm: Secondary | ICD-10-CM | POA: Diagnosis not present

## 2019-01-21 DIAGNOSIS — E0590 Thyrotoxicosis, unspecified without thyrotoxic crisis or storm: Secondary | ICD-10-CM | POA: Diagnosis not present

## 2019-02-15 ENCOUNTER — Other Ambulatory Visit: Payer: Self-pay | Admitting: *Deleted

## 2019-02-15 DIAGNOSIS — Z20822 Contact with and (suspected) exposure to covid-19: Secondary | ICD-10-CM

## 2019-02-21 LAB — NOVEL CORONAVIRUS, NAA: SARS-CoV-2, NAA: NOT DETECTED

## 2019-02-24 ENCOUNTER — Telehealth: Payer: Self-pay | Admitting: Family Medicine

## 2019-02-24 IMAGING — CR DG CHEST 2V
2 series · 2 of 2 positions shown · non-contrast
Comparison: None.

CLINICAL DATA: Palpitations starting tonight. Vertigo several weeks
ago. Pain in the left arm and shoulder starting today.

EXAM:
CHEST - 2 VIEW

[w chest pa]
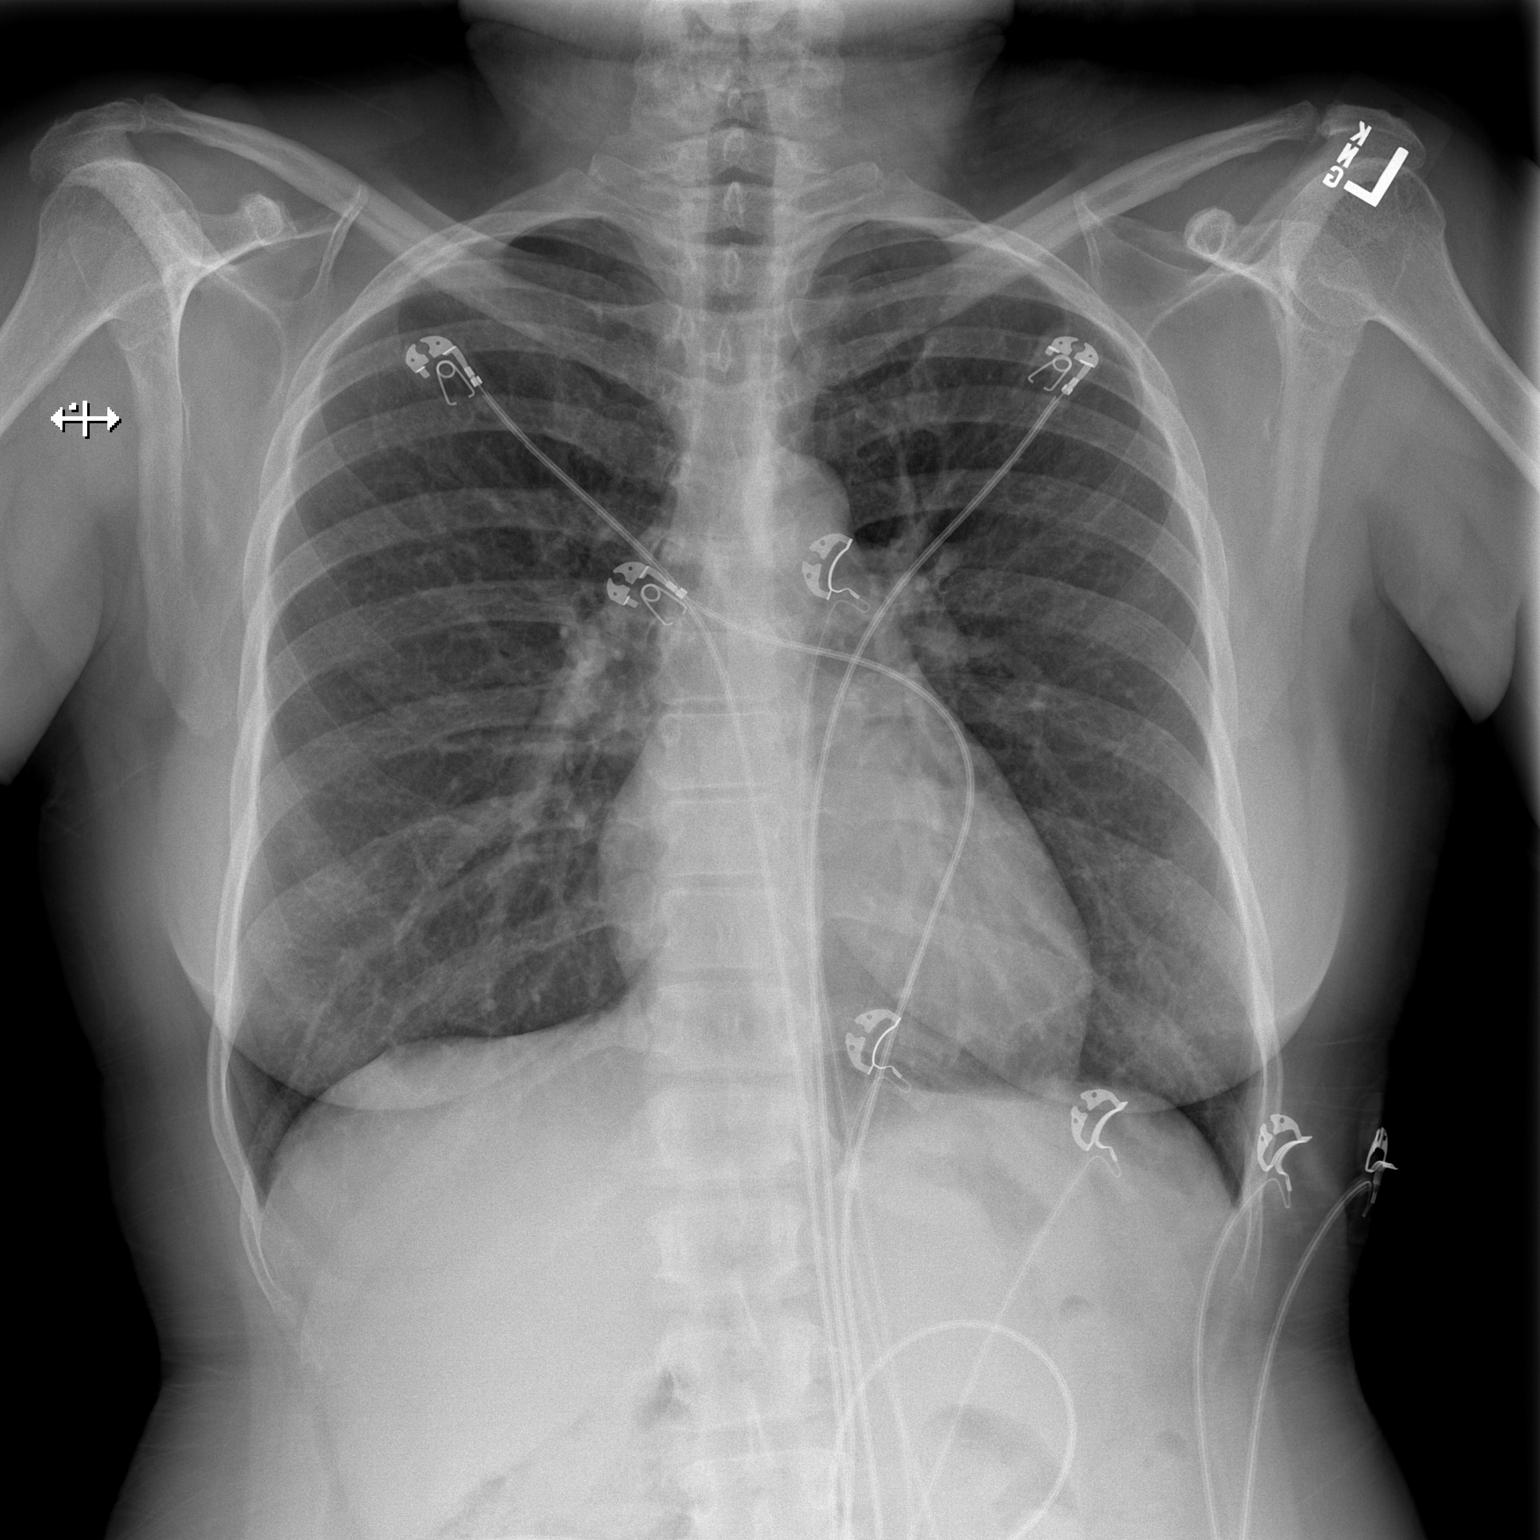

[w chest lat]
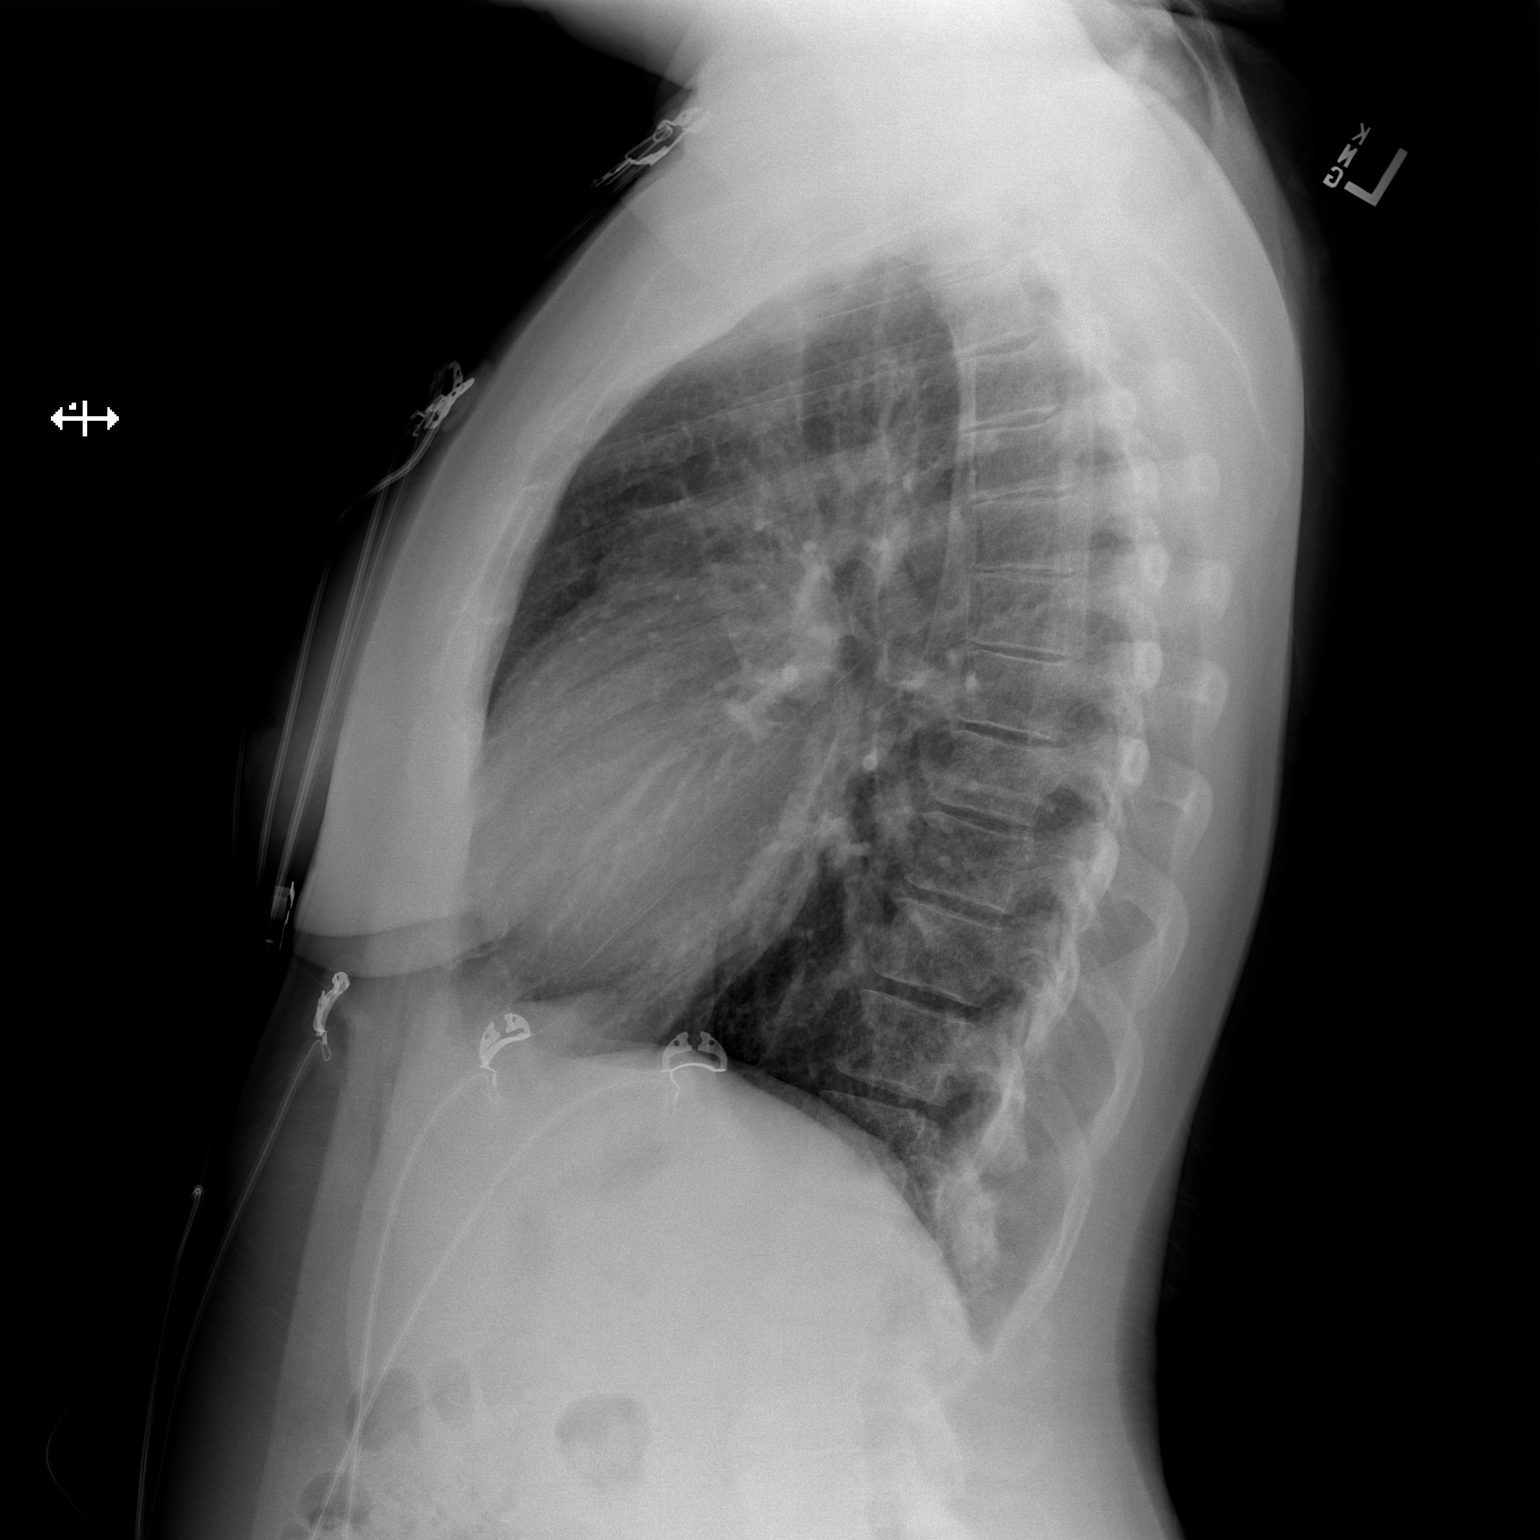

[2 of 2 positions shown; findings below may reference images not displayed]

FINDINGS: The heart size and mediastinal contours are within normal limits.
Both lungs are clear. The visualized skeletal structures are
unremarkable.
IMPRESSION: No active cardiopulmonary disease.

## 2019-02-24 NOTE — Telephone Encounter (Signed)
Patient advised her COVID 19 test results came back negative. °

## 2019-02-27 IMAGING — CR DG CHEST 2V
2 series · 2 of 2 positions shown · non-contrast
Comparison: Chest radiograph performed 01/18/2018

CLINICAL DATA: Acute onset of palpitations. Hypokalemia.

EXAM:
CHEST - 2 VIEW

[w chest pa]
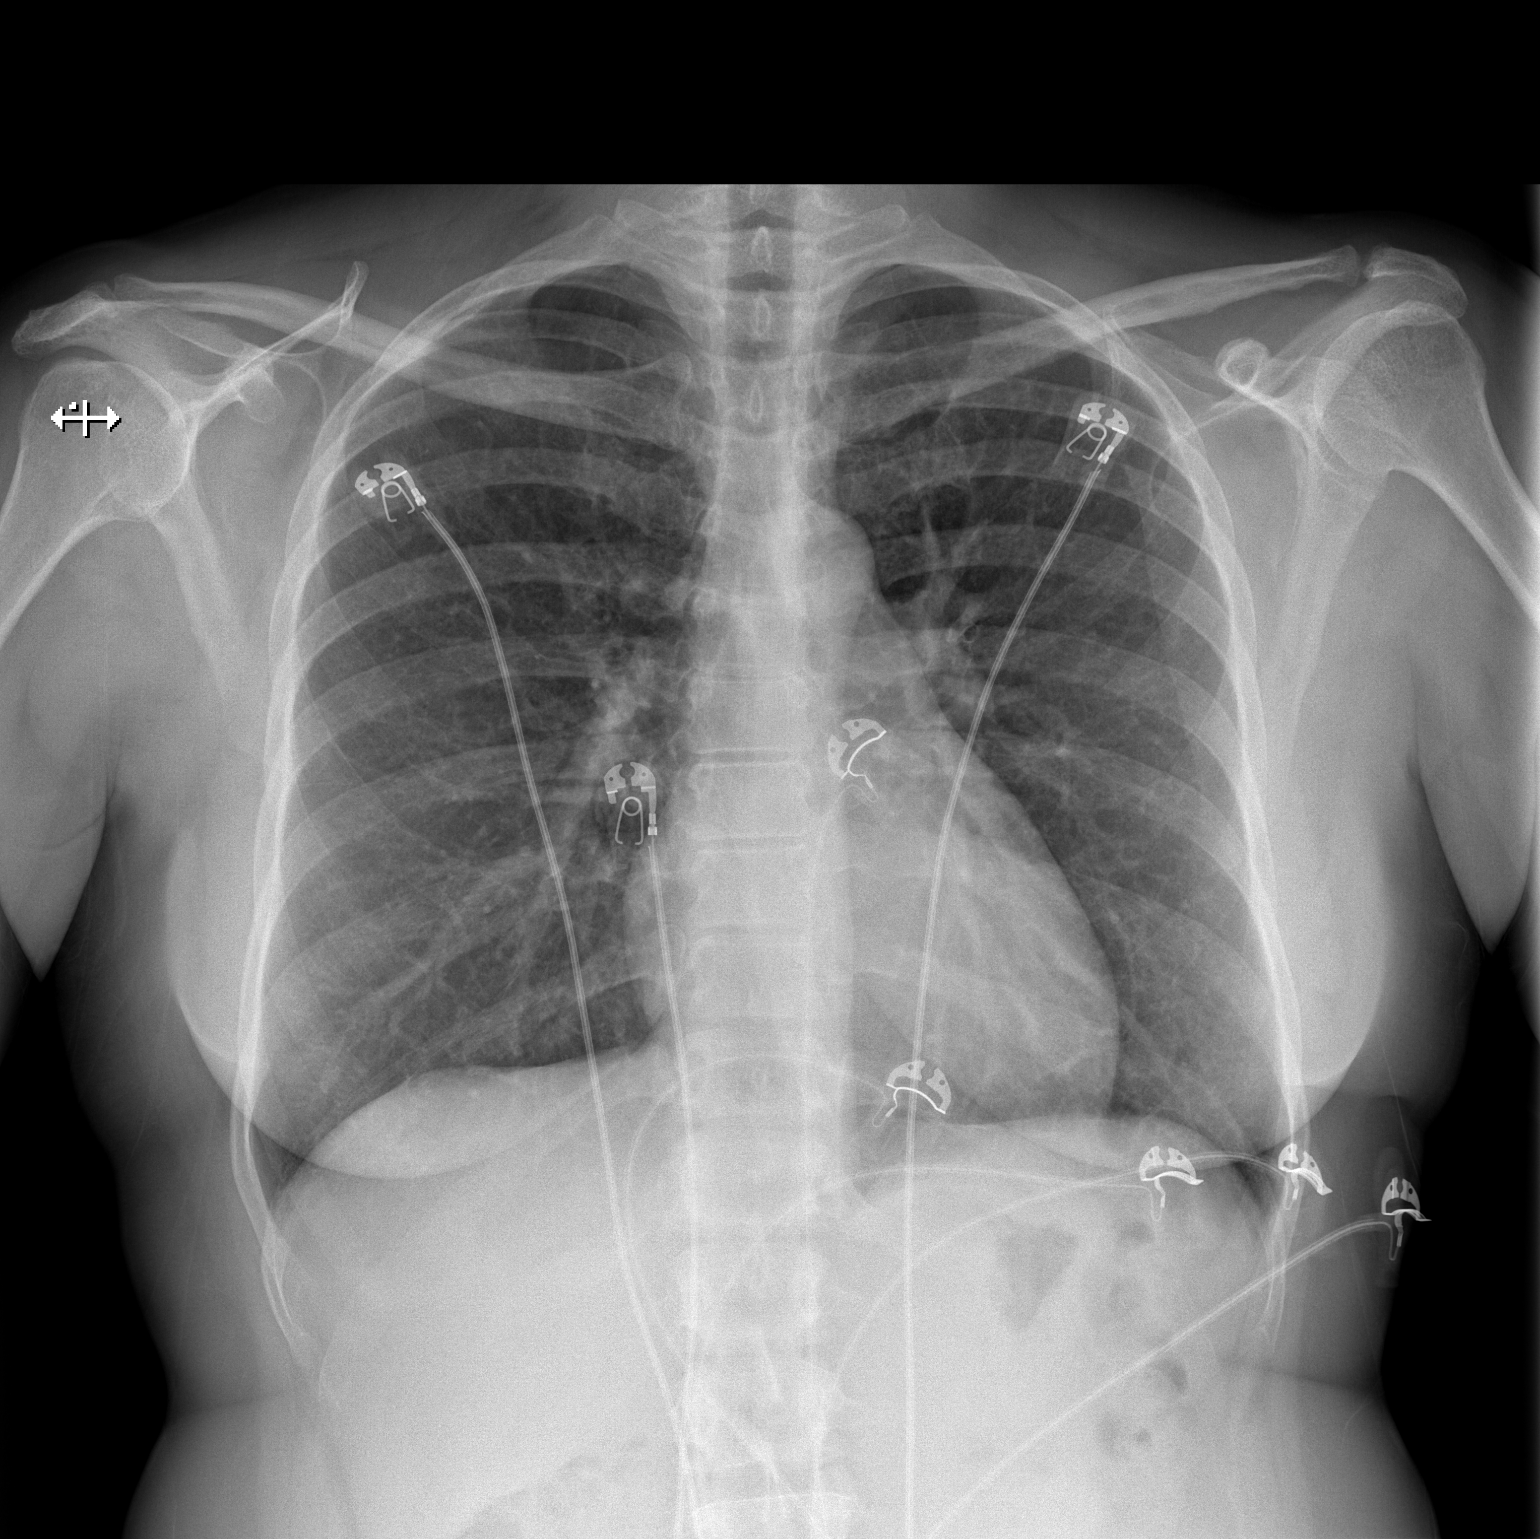

[w chest lat]
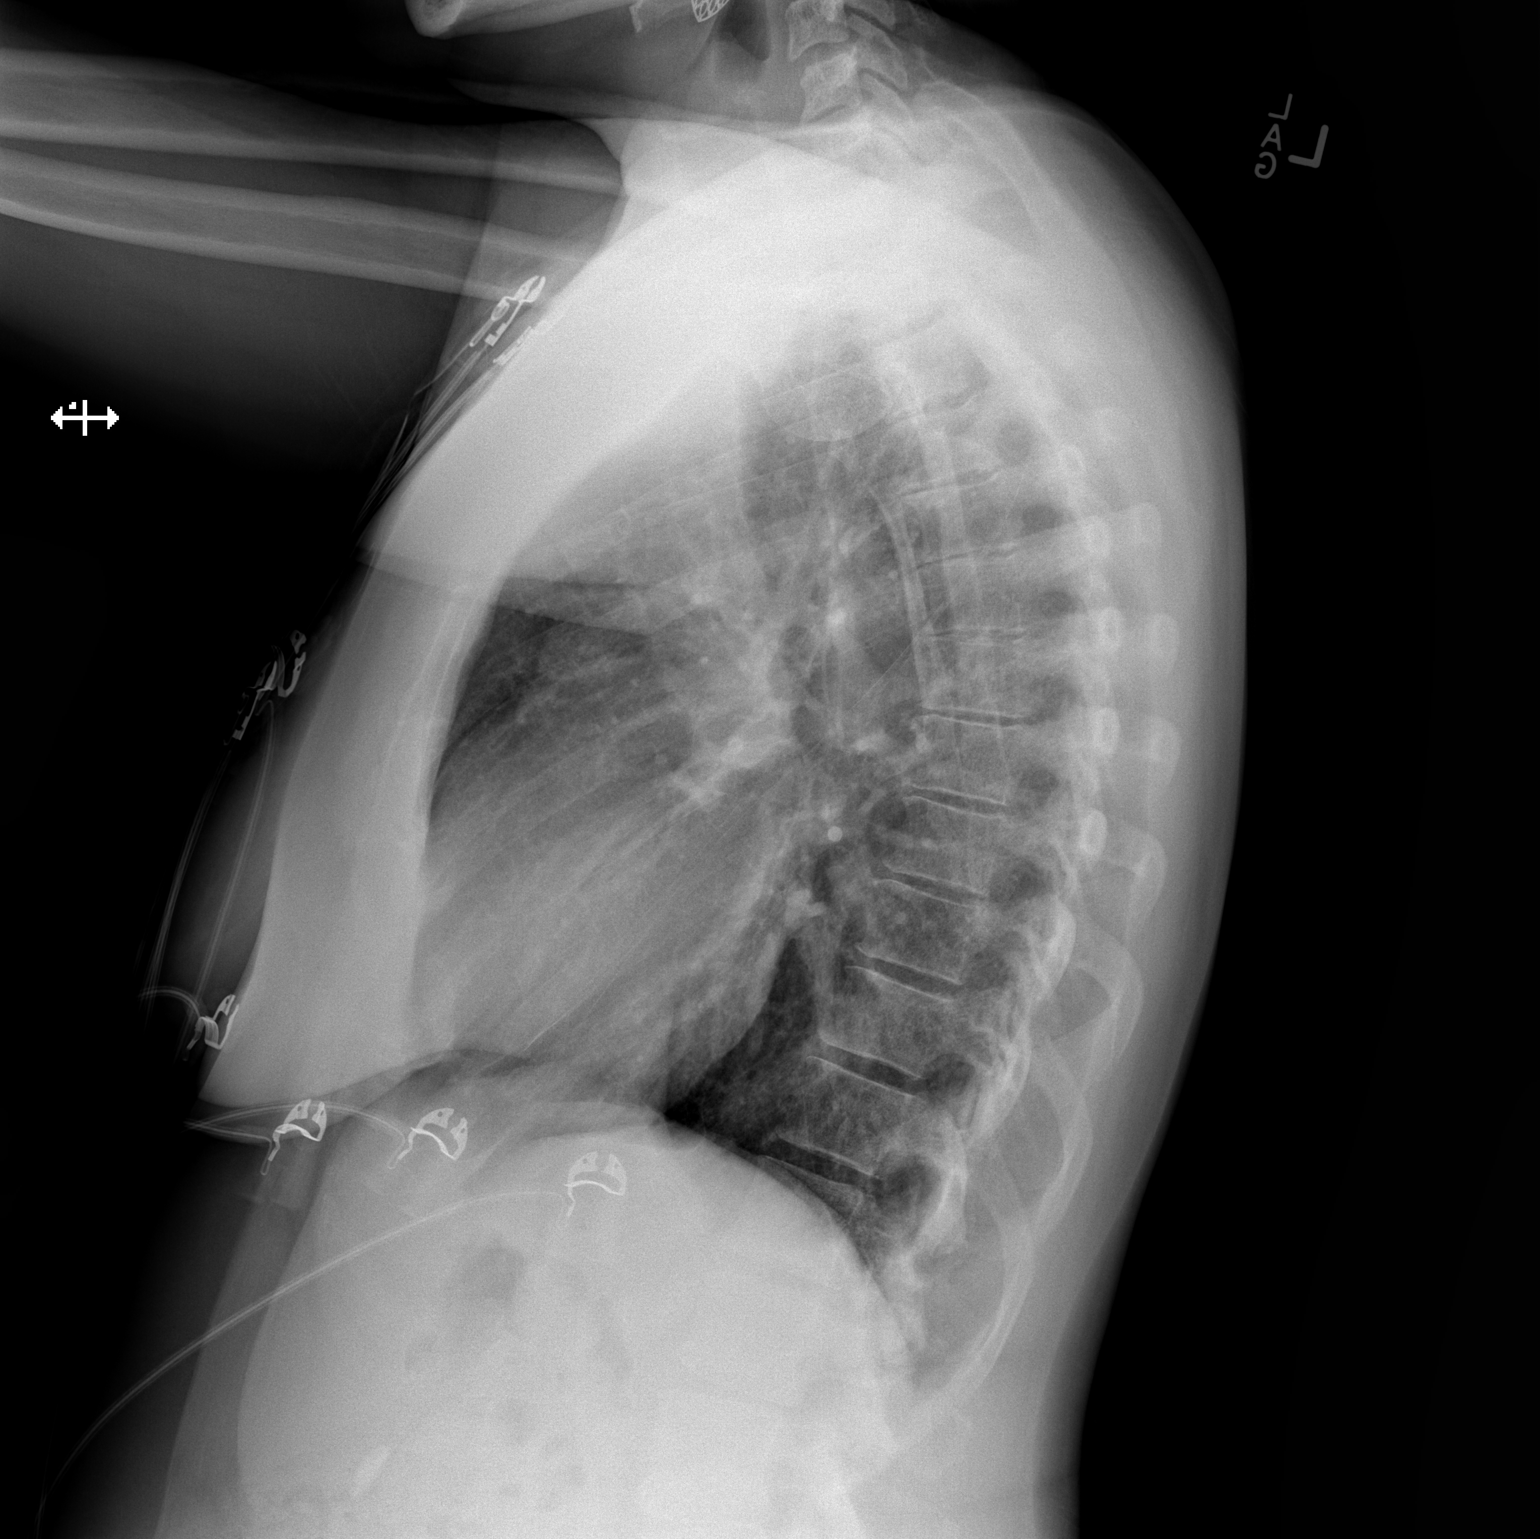

[2 of 2 positions shown; findings below may reference images not displayed]

FINDINGS: The lungs are well-aerated and clear. There is no evidence of focal
opacification, pleural effusion or pneumothorax.

The heart is normal in size; the mediastinal contour is within
normal limits. No acute osseous abnormalities are seen.
IMPRESSION: No acute cardiopulmonary process seen.

## 2019-06-14 DIAGNOSIS — N951 Menopausal and female climacteric states: Secondary | ICD-10-CM | POA: Diagnosis not present

## 2019-09-20 DIAGNOSIS — M542 Cervicalgia: Secondary | ICD-10-CM | POA: Diagnosis not present

## 2019-09-20 DIAGNOSIS — M791 Myalgia, unspecified site: Secondary | ICD-10-CM | POA: Diagnosis not present

## 2019-10-12 ENCOUNTER — Ambulatory Visit
Admission: EM | Admit: 2019-10-12 | Discharge: 2019-10-12 | Disposition: A | Discharge status: Home / Self Care | Payer: Medicaid Other | Attending: Physician Assistant | Admitting: Physician Assistant

## 2019-10-12 ENCOUNTER — Other Ambulatory Visit: Payer: Self-pay

## 2019-10-12 DIAGNOSIS — R002 Palpitations: Secondary | ICD-10-CM | POA: Diagnosis not present

## 2019-10-12 DIAGNOSIS — M62838 Other muscle spasm: Secondary | ICD-10-CM | POA: Diagnosis not present

## 2019-10-12 LAB — CBC WITH DIFFERENTIAL/PLATELET
Basophils Absolute: 0 10*3/uL (ref 0.0–0.2)
Basos: 0 %
EOS (ABSOLUTE): 0.1 10*3/uL (ref 0.0–0.4)
Eos: 1 %
Hematocrit: 38.1 % (ref 34.0–46.6)
Hemoglobin: 13.4 g/dL (ref 11.1–15.9)
Immature Grans (Abs): 0 10*3/uL (ref 0.0–0.1)
Immature Granulocytes: 0 %
Lymphocytes Absolute: 2 10*3/uL (ref 0.7–3.1)
Lymphs: 37 %
MCH: 34.1 pg — ABNORMAL HIGH (ref 26.6–33.0)
MCHC: 35.2 g/dL (ref 31.5–35.7)
MCV: 97 fL (ref 79–97)
Monocytes Absolute: 0.3 10*3/uL (ref 0.1–0.9)
Monocytes: 6 %
Neutrophils Absolute: 3.1 10*3/uL (ref 1.4–7.0)
Neutrophils: 56 %
Platelets: 247 10*3/uL (ref 150–450)
RBC: 3.93 x10E6/uL (ref 3.77–5.28)
RDW: 11.5 % — ABNORMAL LOW (ref 11.7–15.4)
WBC: 5.5 10*3/uL (ref 3.4–10.8)

## 2019-10-12 LAB — BASIC METABOLIC PANEL
BUN/Creatinine Ratio: 18 (ref 9–23)
BUN: 13 mg/dL (ref 6–24)
CO2: 23 mmol/L (ref 20–29)
Calcium: 9.5 mg/dL (ref 8.7–10.2)
Chloride: 103 mmol/L (ref 96–106)
Creatinine, Ser: 0.73 mg/dL (ref 0.57–1.00)
GFR calc Af Amer: 110 mL/min/{1.73_m2} (ref >59)
GFR calc non Af Amer: 95 mL/min/{1.73_m2} (ref >59)
Glucose: 91 mg/dL (ref 65–99)
Potassium: 4.2 mmol/L (ref 3.5–5.2)
Sodium: 141 mmol/L (ref 134–144)

## 2019-10-12 LAB — TSH: TSH: 1.86 u[IU]/mL (ref 0.450–4.500)

## 2019-10-12 MED ORDER — TIZANIDINE HCL 4 MG PO TABS: 4.0000 mg | ORAL_TABLET | Freq: Three times a day (TID) | ORAL | 0 refills | Status: DC | PRN

## 2019-10-12 NOTE — Discharge Instructions (Signed)
No alarming signs on exam. Keep hydrated, urine should be clear to pale yellow in color. CBC, BMP, TSH drawn today for further evaluation. Tizanidine as needed, this can make you drowsy, so do not take if you are going to drive, operate heavy machinery, or make important decisions. Ice/heat compresses as needed. If wanting to continue to exercise, can take ibuprofen 800mg  prior to exercise to see if this prevents your symptoms. If significant worsening of symptoms, with chest pain, shortness of breath, weakness, dizziness, go to the ED for further evaluation. Otherwise, follow up with PCP as scheduled for reevaluation needed.

## 2019-10-12 NOTE — ED Provider Notes (Signed)
[name]    [name]: [id number] Arrival date & time: 10/12/19  1111      History   Chief Complaint Chief Complaint  Patient presents with  . Palpitations    HPI Melinda Charles is a 54 y.o. female.   54 year old female comes in for 2-week history of muscle fatigue, myalgia, palpitations.  Patient states this occurs after working out/any exertion.  States she is symptom-free during exertion.  About 1 to 2 hours after exercise, she will feel muscle tightness, fatigue, particularly to the neck, bilateral trapezius area.  After starting the symptoms, she feels that she gets a panic attack, where she feels palpitation, hyperventilation.  She denies having palpitation with hyperventilation/shortness of breath if she did not first feel the muscle symptoms.  She denies any exertional fatigue, exertional chest pain, exertional shortness of breath.  Denies baseline myalgia. Denies weakness, dizziness, syncope. She has had the symptoms in the past, where she thought was due to hormonal changes from being perimenopausal.  She was suggested to start take over-the-counter supplements, for which did not help the symptoms.  She is now postmenopausal, and is interested in possible HRT, as she is unsure if the symptoms are due to hormones.  She also has a history of hypothyroidism, and was put on medication and followed by endocrinology, states will start on medication a month ago, with normal TSH.  Denies abdominal pain, nausea, vomiting. Had history of GERD due to h pylori, denies GERD symptoms after resolution of h pylori. She denies any caffeine use, regular alcohol use.     Past Medical History:  Diagnosis Date  . Gastroesophageal reflux disease without esophagitis 09/27/2015   Last Assessment & Plan:  2 month history of symptoms Supplies sent to obtain stool sample for H pylori test Trial H2blocker or PPI/reflux precautions Last Assessment & Plan:  2 month history of symptoms Supplies  sent to obtain stool sample for H pylori test Trial H2blocker or PPI/reflux precautions  . Palpitations 01/22/2018  . Unsteadiness 12/30/2017   Last Assessment & Plan:  Concern over unsteadiness. 2-week history of unsteady sensation.  Almost constant.  Started with significant spinning vertigo but those episodes have resolved.  Denies sudden change in hearing.  No prior history of similar symptoms. EXAM shows normal external canals and tympanic membranes bilaterally.  Hallpike maneuver produced no vertigo with either ear down. PLAN: It is  . Varicose veins of leg with pain 04/01/2014   Last Assessment & Plan:  Generalized achiness related to bilateral varicose veins.  Discussed support stockings, extremity elevation, both of which she has done in the past.  She is interested in procedures to alleviate the pain. Last Assessment & Plan:  Generalized achiness related to bilateral varicose veins.  Discussed support stockings, extremity elevation, both of which she has done in the pa    Patient Active Problem List   Diagnosis Date Noted  . Special screening for malignant neoplasms, colon 02/11/2018  . History of Helicobacter pylori infection 02/11/2018  . Palpitations 01/22/2018  . Unsteadiness 12/30/2017  . Gastroesophageal reflux disease without esophagitis 09/27/2015  . Varicose veins of leg with pain 04/01/2014    History reviewed. No pertinent surgical history.  OB History   No obstetric history on file.      Home Medications    Prior to Admission medications   Medication Sig Start Date End Date Taking? Authorizing Provider  tiZANidine (ZANAFLEX) 4 MG tablet Take 1 tablet (4 mg total) by mouth every 8 (  eight) hours as needed for muscle spasms. 10/12/19   Belinda Fisher, PA-C    Family History Family History  Problem Relation Age of Onset  . Hypertension Mother     Social History Social History   Tobacco Use  . Smoking status: Never Smoker  . Smokeless tobacco: Never Used  Substance  Use Topics  . Alcohol use: Never  . Drug use: Never     Allergies   Patient has no known allergies.   Review of Systems Review of Systems  Reason unable to perform ROS: See HPI as above.   Physical Exam Triage Vital Signs ED Triage Vitals  Enc Vitals Group     BP 10/12/19 1143 133/72     Pulse Rate 10/12/19 1143 66     Resp 10/12/19 1143 18     Temp 10/12/19 1143 98.5 F (36.9 C)     Temp Source 10/12/19 1143 Oral     SpO2 10/12/19 1148 98 %     Weight --      Height --      Head Circumference --      Peak Flow --      Pain Score 10/12/19 1144 0     Pain Loc --      Pain Edu? --      Excl. in GC? --    No data found.  Updated Vital Signs BP 133/72 (BP Location: Left Arm)   Pulse 66   Temp 98.5 F (36.9 C) (Oral)   Resp 18   SpO2 98%   Physical Exam Constitutional:      General: She is not in acute distress.    Appearance: She is well-developed. She is not diaphoretic.  HENT:     Head: Normocephalic and atraumatic.  Eyes:     Conjunctiva/sclera: Conjunctivae normal.     Pupils: Pupils are equal, round, and reactive to light.  Cardiovascular:     Rate and Rhythm: Normal rate and regular rhythm.     Heart sounds: Normal heart sounds. No murmur. No friction rub. No gallop.   Pulmonary:     Effort: Pulmonary effort is normal. No accessory muscle usage or respiratory distress.     Breath sounds: Normal breath sounds. No stridor. No decreased breath sounds, wheezing, rhonchi or rales.  Musculoskeletal:     Right lower leg: No edema.     Left lower leg: No edema.     Comments: No tenderness on palpation of the spinous processes. No tenderness to palpation of the neck. Some tenderness to the middle trapezius/thoracic back area bilaterally. Full range of motion of neck, shoulder, back. Strength normal and equal bilaterally. Sensation intact and equal bilaterally. Radial pulses 2+ and equal bilaterally. Capillary refill less than 2 seconds.   Skin:    General:  Skin is warm and dry.  Neurological:     Mental Status: She is alert and oriented to person, place, and time.      UC Treatments / Results  Labs (all labs ordered are listed, but only abnormal results are displayed) Labs Reviewed  CBC WITH DIFFERENTIAL/PLATELET - Abnormal; Notable for the following components:      Result Value   MCH 34.1 (*)    RDW 11.5 (*)    All other components within normal limits   Narrative:    Performed at:  [address], [place], [place]  [id number] Lab Director: Jolene Schimke MD, Phone:  [phone]  BASIC METABOLIC PANEL  Narrative:    Performed at:  [address] [place], [place]  [id number] Lab Director: Rush Farmer MD, Phone:  [phone]  TSH   Narrative:    Performed at:  [address] [place], [place]  [id number] Lab Director: Rush Farmer MD, Phone:  [phone]    EKG   Radiology No results found.  Procedures Procedures (including critical care time)  Medications Ordered in UC Medications - No data to display  Initial Impression / Assessment and Plan / UC Course  I have reviewed the triage vital signs and the nursing notes.  Pertinent labs & imaging results that were available during my care of the patient were reviewed by me and considered in my medical decision making (see chart for details).    No alarming signs on exam. EKG NSR with sinus arrhythmia, 70bpm, nonspecific T wave abnormality that is unchanged from prior EKG. At this time, will draw CBC, BMP, TSH for further evaluation of current symptoms. Patient without palpitations, chest pain, shortness of breath during exertion. No exertional fatigue. Lower suspicion for cardiac causes of symptoms. Palpitations seems to happen while episode of what patient feels is a panic attack as a result of having muscle fatigue/muscle tightness. Only with muscle symptoms post exertion, and will only experience  palpitation/hyperventilation if she has had muscle symptoms. Will try tizanidine at this time and monitor for any changes in palpitation/hyperventilation. Push fluids. Return precautions given. Otherwise to follow up with PCP as scheduled for further evaluation needed. Patient expresses understanding and agrees to plan. Patient discharged in stable condition pending lab results.  CBC, BMP, TSH within normal limits. Will have patient follow original plan at this time. Left message for patient to call back for lab results.  Final Clinical Impressions(s) / UC Diagnoses   Final diagnoses:  Muscle spasm  Palpitations   ED Prescriptions    Medication Sig Dispense Auth. Provider   tiZANidine (ZANAFLEX) 4 MG tablet Take 1 tablet (4 mg total) by mouth every 8 (eight) hours as needed for muscle spasms. 15 tablet Ok Edwards, PA-C     PDMP not reviewed this encounter.   Ok Edwards, PA-C 10/12/19 [phone]

## 2019-10-12 NOTE — ED Triage Notes (Signed)
Pt c/o palpitations and muscle fatigue x2wks after started working out gym. States feels like her hypothyroidism, stopped her meds a month ago.

## 2019-10-15 ENCOUNTER — Telehealth: Payer: Self-pay | Admitting: Emergency Medicine

## 2019-10-15 NOTE — Telephone Encounter (Signed)
Called patient to review labs from recent visit.  Confirmed her identity and then provided WNL results.  Per Amy, APP, patient encouraged to continue to use muscle relaxant and to follow-up with PCP.  Patient verbalized understanding.

## 2019-10-29 ENCOUNTER — Encounter: Payer: Self-pay | Admitting: Internal Medicine

## 2019-11-04 ENCOUNTER — Ambulatory Visit (INDEPENDENT_AMBULATORY_CARE_PROVIDER_SITE_OTHER): Payer: Medicaid Other | Admitting: Internal Medicine

## 2019-11-04 DIAGNOSIS — Z7689 Persons encountering health services in other specified circumstances: Secondary | ICD-10-CM | POA: Diagnosis not present

## 2019-11-04 DIAGNOSIS — M62838 Other muscle spasm: Secondary | ICD-10-CM

## 2019-11-04 DIAGNOSIS — F410 Panic disorder [episodic paroxysmal anxiety] without agoraphobia: Secondary | ICD-10-CM

## 2019-11-04 NOTE — Progress Notes (Signed)
Virtual Visit via Telephone Note  I connected with Melinda Charles, on 11/04/2019 at 1:52 PM by telephone due to the COVID-19 pandemic and verified that I am speaking with the correct person using two identifiers.   Consent: I discussed the limitations, risks, security and privacy concerns of performing an evaluation and management service by telephone and the availability of in person appointments. I also discussed with the patient that there may be a patient responsible charge related to this service. The patient expressed understanding and agreed to proceed.   Location of Patient: Home   Location of Provider: Clinic    Persons participating in Telemedicine visit: Melinda Charles Dr. Juleen China      History of Present Illness: Patient has a visit to establish care. Patient denies significant PMH. She is taking MVI and Ca/Mg/Zinc supplement. Patient went to urgent care for muscle spasms and palpations on 2/16. She thinks these symptoms she is having is related to panic attacks. Reports that this seemed to start with menopause. Sometimes attacks are short and sometimes they last about 15 minutes. She was prescribed Tizanidine, she didn't like how that made her feel and it made her feel worse. Her LMP was >1 year ago.    Past Medical History:  Diagnosis Date  . Gastroesophageal reflux disease without esophagitis 09/27/2015   Last Assessment & Plan:  2 month history of symptoms Supplies sent to obtain stool sample for H pylori test Trial H2blocker or PPI/reflux precautions Last Assessment & Plan:  2 month history of symptoms Supplies sent to obtain stool sample for H pylori test Trial H2blocker or PPI/reflux precautions  . History of Helicobacter pylori infection   . Hyperthyroidism   . Varicose veins of leg with pain 04/01/2014   Last Assessment & Plan:  Generalized achiness related to bilateral varicose veins.  Discussed support stockings, extremity elevation,  both of which she has done in the past.  She is interested in procedures to alleviate the pain. Last Assessment & Plan:  Generalized achiness related to bilateral varicose veins.  Discussed support stockings, extremity elevation, both of which she has done in the pa   No Known Allergies  Current Outpatient Medications on File Prior to Visit  Medication Sig Dispense Refill  . Calcium-Magnesium-Zinc 500-250-12.5 MG TABS Take 1 tablet by mouth daily.    . Multiple Vitamin (MULTIVITAMIN) tablet Take 1 tablet by mouth daily.    Marland Kitchen tiZANidine (ZANAFLEX) 4 MG tablet Take 1 tablet (4 mg total) by mouth every 8 (eight) hours as needed for muscle spasms. 15 tablet 0   No current facility-administered medications on file prior to visit.    Observations/Objective: NAD. Speaking clearly.  Work of breathing normal.  Alert and oriented. Mood appropriate.   Assessment and Plan: 1. Encounter to establish care  2. Muscle spasm Likely multifactorial as patient experiences them at different times. She experiences them with panic attacks but also after a lot of physical activity. Reviewed that prior labs were normal. Encouraged adequate hydration with physical activity before and after. Patient believes her fluid intake may be low for the amount of activity she does.   3. Panic attacks She is interested in trying/learning more about CBT. Will schedule her with Jasmine.    Follow Up Instructions: F/u with [name] and with PCP for annual exam with PAP    I discussed the assessment and treatment plan with the patient. The patient was provided an opportunity to ask questions and all were answered. The patient  agreed with the plan and demonstrated an understanding of the instructions.   The patient was advised to call back or seek an in-person evaluation if the symptoms worsen or if the condition fails to improve as anticipated.     I provided 14 minutes total of non-face-to-face time during this  encounter including median intraservice time, reviewing previous notes, investigations, ordering medications, medical decision making, coordinating care and patient verbalized understanding at the end of the visit.    Marcy Siren, D.O. Primary Care at [facility]  11/04/2019, 1:52 PM

## 2019-11-16 ENCOUNTER — Other Ambulatory Visit: Payer: Self-pay

## 2019-11-16 ENCOUNTER — Ambulatory Visit (INDEPENDENT_AMBULATORY_CARE_PROVIDER_SITE_OTHER): Payer: Medicaid Other | Admitting: Licensed Clinical Social Worker

## 2019-11-16 DIAGNOSIS — F411 Generalized anxiety disorder: Secondary | ICD-10-CM

## 2019-11-23 ENCOUNTER — Telehealth: Payer: Self-pay

## 2019-11-23 NOTE — Telephone Encounter (Signed)
Called patient to do their pre-visit COVID screening.  Call went to voicemail. Unable to do prescreening.  

## 2019-11-23 NOTE — Patient Instructions (Signed)

## 2019-11-24 ENCOUNTER — Other Ambulatory Visit (HOSPITAL_COMMUNITY)
Admission: RE | Admit: 2019-11-24 | Discharge: 2019-11-24 | Disposition: A | Discharge status: Home / Self Care | Payer: Medicaid Other | Source: Ambulatory Visit | Attending: Internal Medicine | Admitting: Internal Medicine

## 2019-11-24 ENCOUNTER — Encounter: Payer: Self-pay | Admitting: Internal Medicine

## 2019-11-24 ENCOUNTER — Ambulatory Visit (INDEPENDENT_AMBULATORY_CARE_PROVIDER_SITE_OTHER): Payer: Medicaid Other | Admitting: Internal Medicine

## 2019-11-24 ENCOUNTER — Other Ambulatory Visit: Payer: Self-pay

## 2019-11-24 VITALS — BP 111/68 | HR 66 | Temp 97.3°F | Resp 17 | Wt 158.0 lb

## 2019-11-24 DIAGNOSIS — Z124 Encounter for screening for malignant neoplasm of cervix: Secondary | ICD-10-CM | POA: Insufficient documentation

## 2019-11-24 DIAGNOSIS — Z113 Encounter for screening for infections with a predominantly sexual mode of transmission: Secondary | ICD-10-CM | POA: Insufficient documentation

## 2019-11-24 DIAGNOSIS — R2689 Other abnormalities of gait and mobility: Secondary | ICD-10-CM

## 2019-11-24 DIAGNOSIS — Z1231 Encounter for screening mammogram for malignant neoplasm of breast: Secondary | ICD-10-CM

## 2019-11-24 DIAGNOSIS — Z0000 Encounter for general adult medical examination without abnormal findings: Secondary | ICD-10-CM | POA: Diagnosis not present

## 2019-11-24 NOTE — Progress Notes (Signed)
Subjective:    Melinda Charles - 54 y.o. female MRN [id number]  Date of birth: 12-18-1965  HPI  Melinda Charles is here for annual exam. Patient accepts STD screening. She is also concerned she may have a yeast infection.   Has concerns about feeling "off balance". Reports this has been an ongoing problem since she had an episode of vertigo in 2019. She was seen by neurology at that time. Reports MRI brain was considered but not ordered. She also went to vestibular rehab. Says she has not had another episode of vertigo but when she does something that is physically taxing or sometimes with position changes she will feel lack of balance. She requests to re-establish with neuro. Also endorses some pain through her cervical spine and shoulders.      Health Maintenance: Health Maintenance Due  Topic Date Due  . HIV Screening  Never done  . PAP SMEAR-Modifier  04/02/2019    -  reports that she has never smoked. She has never used smokeless tobacco. - Review of Systems: Per HPI. - Past Medical History: Patient Active Problem List   Diagnosis Date Noted  . Hyperthyroidism 10/23/2018  . Special screening for malignant neoplasms, colon 02/11/2018  . History of Helicobacter pylori infection 02/11/2018  . Palpitations 01/22/2018  . Unsteadiness 12/30/2017  . Gastroesophageal reflux disease without esophagitis 09/27/2015  . 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  HENT:  Head: Normocephalic and atraumatic.  Mouth/Throat: Oropharynx is clear and moist.  Eyes: Pupils are equal, round, and reactive to light. Conjunctivae and EOM are normal.  Neck: No  thyromegaly present.  Cardiovascular: Normal rate, regular rhythm, normal heart sounds and intact distal pulses.  No murmur heard. Pulmonary/Chest: Effort normal and breath sounds normal. No respiratory distress. She has no wheezes.  Breast exam: Breasts symmetric. No erythema/rashes/skin color changes/skin texture changes/nipple discharge. No discrete palpable masses appreciated in axilla or 4 quadrants of the breast bilaterally.   Abdominal: Soft. Bowel sounds are normal. She exhibits no distension. There is no abdominal tenderness. There is no rebound and no guarding.  Genitourinary:    Genitourinary Comments: Exam performed in the presence of a chaperone. External genitalia within normal limits.  Vaginal mucosa pink, moist, normal rugae.  Nonfriable cervix without lesions, no discharge or bleeding noted on speculum exam.  Bimanual exam revealed normal, nongravid uterus.  No cervical motion tenderness. No adnexal masses bilaterally.     Musculoskeletal:        General: No deformity or edema. Normal range of motion.     Cervical back: Normal range of motion and neck supple.  Lymphadenopathy:    She has no cervical adenopathy.  Neurological: She is alert and oriented to person, place, and time. No cranial nerve deficit. She exhibits normal muscle tone. Gait normal. Coordination normal.  Skin: Skin is warm and dry. No rash noted. She is not diaphoretic.  Psychiatric: Mood, affect and judgment normal.           Assessment & Plan:   1. Annual physical exam - CBC with Differential - Comprehensive metabolic panel -  Lipid Panel  2. Pap smear for cervical cancer screening - Cytology - PAP([facility])  3. Screening for STDs (sexually transmitted diseases) - Cervicovaginal ancillary only - HIV antibody (with reflex) - [facility]  4. Breast cancer screening by mammogram - MM Digital Screening; Future  5. Balance problem Patient with prior history of vertigo requiring vestibular rehab with  lingering symptoms. 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 Would recommend re-establishing with neurology. Will defer potential imaging of brain or C spine to neuro.  - Ambulatory referral to Neurology      Phill Myron, D.O. 11/24/2019, 3:59 PM Primary Care at [facility]

## 2019-11-25 LAB — COMPREHENSIVE METABOLIC PANEL
ALT: 10 IU/L (ref 0–32)
AST: 18 IU/L (ref 0–40)
Albumin/Globulin Ratio: 1.9 (ref 1.2–2.2)
Albumin: 4.7 g/dL (ref 3.8–4.9)
Alkaline Phosphatase: 72 IU/L (ref 39–117)
BUN/Creatinine Ratio: 21 (ref 9–23)
BUN: 13 mg/dL (ref 6–24)
Bilirubin Total: 0.2 mg/dL (ref 0.0–1.2)
CO2: 22 mmol/L (ref 20–29)
Calcium: 9.5 mg/dL (ref 8.7–10.2)
Chloride: 104 mmol/L (ref 96–106)
Creatinine, Ser: 0.63 mg/dL (ref 0.57–1.00)
GFR calc Af Amer: 119 mL/min/{1.73_m2} (ref >59)
GFR calc non Af Amer: 103 mL/min/{1.73_m2} (ref >59)
Globulin, Total: 2.5 g/dL (ref 1.5–4.5)
Glucose: 108 mg/dL — ABNORMAL HIGH (ref 65–99)
Potassium: 4 mmol/L (ref 3.5–5.2)
Sodium: 142 mmol/L (ref 134–144)
Total Protein: 7.2 g/dL (ref 6.0–8.5)

## 2019-11-25 LAB — CBC WITH DIFFERENTIAL/PLATELET
Basophils Absolute: 0 10*3/uL (ref 0.0–0.2)
Basos: 0 %
EOS (ABSOLUTE): 0.1 10*3/uL (ref 0.0–0.4)
Eos: 2 %
Hematocrit: 38.1 % (ref 34.0–46.6)
Hemoglobin: 12.7 g/dL (ref 11.1–15.9)
Immature Grans (Abs): 0 10*3/uL (ref 0.0–0.1)
Immature Granulocytes: 0 %
Lymphocytes Absolute: 2.6 10*3/uL (ref 0.7–3.1)
Lymphs: 42 %
MCH: 31.8 pg (ref 26.6–33.0)
MCHC: 33.3 g/dL (ref 31.5–35.7)
MCV: 95 fL (ref 79–97)
Monocytes Absolute: 0.4 10*3/uL (ref 0.1–0.9)
Monocytes: 6 %
Neutrophils Absolute: 3.1 10*3/uL (ref 1.4–7.0)
Neutrophils: 50 %
Platelets: 267 10*3/uL (ref 150–450)
RBC: 4 x10E6/uL (ref 3.77–5.28)
RDW: 11.7 % (ref 11.7–15.4)
WBC: 6.2 10*3/uL (ref 3.4–10.8)

## 2019-11-25 LAB — HIV ANTIBODY (ROUTINE TESTING W REFLEX): HIV Screen 4th Generation wRfx: NONREACTIVE

## 2019-11-25 LAB — LIPID PANEL
Chol/HDL Ratio: 3 ratio (ref 0.0–4.4)
Cholesterol, Total: 199 mg/dL (ref 100–199)
HDL: 66 mg/dL (ref >39)
LDL Chol Calc (NIH): 114 mg/dL — ABNORMAL HIGH (ref 0–99)
Triglycerides: 105 mg/dL (ref 0–149)
VLDL Cholesterol Cal: 19 mg/dL (ref 5–40)

## 2019-11-25 LAB — RPR: RPR Ser Ql: NONREACTIVE

## 2019-11-26 LAB — CERVICOVAGINAL ANCILLARY ONLY
Bacterial Vaginitis (gardnerella): NEGATIVE
Candida Glabrata: NEGATIVE
Candida Vaginitis: NEGATIVE
Chlamydia: NEGATIVE
Comment: NEGATIVE
Comment: NEGATIVE
Comment: NEGATIVE
Comment: NEGATIVE
Comment: NEGATIVE
Comment: NORMAL
Neisseria Gonorrhea: NEGATIVE
Trichomonas: NEGATIVE

## 2019-11-26 LAB — CYTOLOGY - PAP
Comment: NEGATIVE
Diagnosis: NEGATIVE
High risk HPV: NEGATIVE

## 2019-11-29 NOTE — Progress Notes (Signed)
Normal lab letter mailed.

## 2019-11-30 ENCOUNTER — Other Ambulatory Visit: Payer: Self-pay

## 2019-11-30 ENCOUNTER — Ambulatory Visit (INDEPENDENT_AMBULATORY_CARE_PROVIDER_SITE_OTHER): Payer: Medicaid Other | Admitting: Licensed Clinical Social Worker

## 2019-11-30 DIAGNOSIS — F411 Generalized anxiety disorder: Secondary | ICD-10-CM

## 2019-11-30 NOTE — BH Specialist Note (Signed)
Integrated Behavioral Health Follow Up Visit  MRN: [id number] Name: Melinda Charles  Number of Integrated Behavioral Health Clinician visits: 2/6 Session Start time: 3:10 PM  Session End time: 3:40 PM Total time: 30  Type of Service: Integrated Behavioral Health- Individual Interpretor:No. Interpretor Name and Language: NA  SUBJECTIVE: Melinda Charles is a 54 y.o. female accompanied by self Patient was referred by Dr. Earlene Plater for anxiety. Patient reports the following symptoms/concerns: Pt reports decrease in anxiety symptoms; however, endorses  panic attacks after intense muscle strain Duration of problem: Ongoing; Severity of problem: moderate  OBJECTIVE: Mood: Pleasant and Affect: Appropriate Risk of harm to self or others: No plan to harm self or others  LIFE CONTEXT: Family and Social: Pt receives strong support from immediate family School/Work:  Self-Care: Pt identified healthy coping skills Life Changes: Pt reports hx of medical conditions that may impact mental health  GOALS ADDRESSED: Patient will: 1.  Reduce symptoms of: anxiety  2.  Increase knowledge and/or ability of: stress reduction  3.  Demonstrate ability to: Increase healthy adjustment to current life circumstances  INTERVENTIONS: Interventions utilized:  Mindfulness or Management consultant, Supportive Counseling and Psychoeducation and/or Health Education Standardized Assessments completed: PHQ 2&9  ASSESSMENT: Patient currently experiencing anxiety triggered by muscle strain. Pt endorses ongoing panic attacks; however, reports decrease in intensity after medical testing.  Patient may benefit from continued utilization of healthy coping skills and grounding techniques. LCSW discussed how trauma and stress can negatively impact the body and encouraged self-care activities to promote relaxation.   Pt was provided lab results and contact information to [facility], per patient request.   PLAN: 1. Follow  up with behavioral health clinician on : Contact LCSW with any additional behavioral health and/or resource needs 2. Behavioral recommendations: Continue to utilize strategies discussed 3. Referral(s): Integrated Behavioral Health Services (In Clinic) 4. "From scale of 1-10, how likely are you to follow plan?":   Bridgett Larsson, LCSW 11/30/2019 4:38 PM

## 2019-12-09 NOTE — BH Specialist Note (Signed)
Integrated Behavioral Health Initial Visit  MRN: [id number] Name: Melinda Charles  Number of Integrated Behavioral Health Clinician visits:: 1/6 Session Start time: 3:05 PM  Session End time: 3:40 PM Total time: 35   Type of Service: Integrated Behavioral Health- Individual Interpretor:No. Interpretor Name and Language: NA   SUBJECTIVE: Ashiyah White-Winfield is a 54 y.o. female accompanied by self Patient was referred by Dr. Earlene Plater for anxiety. 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 Reduce symptoms of: anxiety 2. Increase knowledge and/or ability of: coping skills and healthy habits  3. Demonstrate ability to: Increase healthy adjustment to current life circumstances and Increase adequate support systems for patient/family  INTERVENTIONS: Interventions utilized: Mindfulness or Management consultant, Supportive Counseling and Psychoeducation and/or Health Education  Standardized Assessments completed: Not Needed  ASSESSMENT: Patient currently experiencing increase in anxiety triggered by concern for medical health. She reports daily panic attacks; however, shared that attacks have decreased in frequency and severity after conducting medical testing.    Patient may benefit from brief psychotherapy. LCSW discussed correlation between one's physical and mental health, in addition,  to how stress can negatively impact both. Grounding strategies discussed to assist in management of symptoms.   PLAN: 1. Follow up with behavioral health clinician on : 11/30/19 2. Behavioral recommendations: Utilize strategies discussed 3. Referral(s): Integrated Behavioral Health Services (In Clinic) 4. "From scale of 1-10, how likely are you to follow plan?":   Bridgett Larsson, LCSW 12/09/19 2:42 PM

## 2019-12-10 ENCOUNTER — Ambulatory Visit: Payer: Medicaid Other

## 2019-12-10 ENCOUNTER — Ambulatory Visit: Payer: Medicaid Other | Admitting: Neurology

## 2019-12-15 ENCOUNTER — Ambulatory Visit: Payer: Medicaid Other

## 2019-12-15 DIAGNOSIS — M9902 Segmental and somatic dysfunction of thoracic region: Secondary | ICD-10-CM | POA: Diagnosis not present

## 2019-12-15 DIAGNOSIS — M9901 Segmental and somatic dysfunction of cervical region: Secondary | ICD-10-CM | POA: Diagnosis not present

## 2019-12-15 DIAGNOSIS — M531 Cervicobrachial syndrome: Secondary | ICD-10-CM | POA: Diagnosis not present

## 2019-12-15 DIAGNOSIS — M5414 Radiculopathy, thoracic region: Secondary | ICD-10-CM | POA: Diagnosis not present

## 2019-12-17 DIAGNOSIS — M531 Cervicobrachial syndrome: Secondary | ICD-10-CM | POA: Diagnosis not present

## 2019-12-17 DIAGNOSIS — M9902 Segmental and somatic dysfunction of thoracic region: Secondary | ICD-10-CM | POA: Diagnosis not present

## 2019-12-17 DIAGNOSIS — M5414 Radiculopathy, thoracic region: Secondary | ICD-10-CM | POA: Diagnosis not present

## 2019-12-17 DIAGNOSIS — M9901 Segmental and somatic dysfunction of cervical region: Secondary | ICD-10-CM | POA: Diagnosis not present

## 2019-12-22 DIAGNOSIS — M9901 Segmental and somatic dysfunction of cervical region: Secondary | ICD-10-CM | POA: Diagnosis not present

## 2019-12-22 DIAGNOSIS — M5414 Radiculopathy, thoracic region: Secondary | ICD-10-CM | POA: Diagnosis not present

## 2019-12-22 DIAGNOSIS — M531 Cervicobrachial syndrome: Secondary | ICD-10-CM | POA: Diagnosis not present

## 2019-12-22 DIAGNOSIS — M9902 Segmental and somatic dysfunction of thoracic region: Secondary | ICD-10-CM | POA: Diagnosis not present

## 2019-12-28 ENCOUNTER — Other Ambulatory Visit: Payer: Self-pay

## 2019-12-28 ENCOUNTER — Encounter: Payer: Self-pay | Admitting: Neurology

## 2019-12-28 ENCOUNTER — Ambulatory Visit: Payer: Medicaid Other | Admitting: Neurology

## 2019-12-28 ENCOUNTER — Telehealth: Payer: Self-pay | Admitting: Neurology

## 2019-12-28 VITALS — BP 108/64 | HR 82 | Temp 96.9°F | Ht 64.0 in | Wt 160.0 lb

## 2019-12-28 DIAGNOSIS — M542 Cervicalgia: Secondary | ICD-10-CM | POA: Diagnosis not present

## 2019-12-28 DIAGNOSIS — R253 Fasciculation: Secondary | ICD-10-CM | POA: Diagnosis not present

## 2019-12-28 DIAGNOSIS — R2681 Unsteadiness on feet: Secondary | ICD-10-CM

## 2019-12-28 DIAGNOSIS — G44219 Episodic tension-type headache, not intractable: Secondary | ICD-10-CM | POA: Diagnosis not present

## 2019-12-28 MED ORDER — CYCLOBENZAPRINE HCL 10 MG PO TABS: 10.0000 mg | ORAL_TABLET | Freq: Every day | ORAL | 0 refills | Status: DC

## 2019-12-28 NOTE — Patient Instructions (Addendum)
Cyclobenzaprine tablets What is this medicine? CYCLOBENZAPRINE (sye kloe BEN za preen) is a muscle relaxer. It is used to treat muscle pain, spasms, and stiffness. This medicine may be used for other purposes; ask your health care provider or pharmacist if you have questions. COMMON BRAND NAME(S): Fexmid, Flexeril What should I tell my health care provider before I take this medicine? They need to know if you have any of these conditions:  heart disease, irregular heartbeat, or previous heart attack  liver disease  thyroid problem  an unusual or allergic reaction to cyclobenzaprine, tricyclic antidepressants, lactose, other medicines, foods, dyes, or preservatives  pregnant or trying to get pregnant  breast-feeding How should I use this medicine? Take this medicine by mouth with a glass of water. Follow the directions on the prescription label. If this medicine upsets your stomach, take it with food or milk. Take your medicine at regular intervals. Do not take it more often than directed. Talk to your pediatrician regarding the use of this medicine in children. Special care may be needed. Overdosage: If you think you have taken too much of this medicine contact a poison control center or emergency room at once. NOTE: This medicine is only for you. Do not share this medicine with others. What if I miss a dose? If you miss a dose, take it as soon as you can. If it is almost time for your next dose, take only that dose. Do not take double or extra doses. What may interact with this medicine? Do not take this medicine with any of the following medications:  MAOIs like Carbex, Eldepryl, Marplan, Nardil, and Parnate  narcotic medicines for cough  safinamide This medicine may also interact with the following medications:  alcohol  bupropion  antihistamines for allergy, cough and cold  certain medicines for anxiety or sleep  certain medicines for bladder problems like oxybutynin,  tolterodine  certain medicines for depression like amitriptyline, fluoxetine, sertraline  certain medicines for Parkinson's disease like benztropine, trihexyphenidyl  certain medicines for seizures like phenobarbital, primidone  certain medicines for stomach problems like dicyclomine, hyoscyamine  certain medicines for travel sickness like scopolamine  general anesthetics like halothane, isoflurane, methoxyflurane, propofol  ipratropium  local anesthetics like lidocaine, pramoxine, tetracaine  medicines that relax muscles for surgery  narcotic medicines for pain  phenothiazines like chlorpromazine, mesoridazine, prochlorperazine, thioridazine  verapamil This list may not describe all possible interactions. Give your health care provider a list of all the medicines, herbs, non-prescription drugs, or dietary supplements you use. Also tell them if you smoke, drink alcohol, or use illegal drugs. Some items may interact with your medicine. What should I watch for while using this medicine? Tell your doctor or health care professional if your symptoms do not start to get better or if they get worse. You may get drowsy or dizzy. Do not drive, use machinery, or do anything that needs mental alertness until you know how this medicine affects you. Do not stand or sit up quickly, especially if you are an older patient. This reduces the risk of dizzy or fainting spells. Alcohol may interfere with the effect of this medicine. Avoid alcoholic drinks. If you are taking another medicine that also causes drowsiness, you may have more side effects. Give your health care provider a list of all medicines you use. Your doctor will tell you how much medicine to take. Do not take more medicine than directed. Call emergency for help if you have problems breathing or unusual sleepiness.  Your mouth may get dry. Chewing sugarless gum or sucking hard candy, and drinking plenty of water may help. Contact your  doctor if the problem does not go away or is severe. What side effects may I notice from receiving this medicine? Side effects that you should report to your doctor or health care professional as soon as possible:  allergic reactions like skin rash, itching or hives, swelling of the face, lips, or tongue  breathing problems  chest pain  fast, irregular heartbeat  hallucinations  seizures  unusually weak or tired Side effects that usually do not require medical attention (report to your doctor or health care professional if they continue or are bothersome):  headache  nausea, vomiting This list may not describe all possible side effects. Call your doctor for medical advice about side effects. You may report side effects to FDA at 1-800-FDA-1088. Where should I keep my medicine? Keep out of the reach of children. Store at room temperature between 15 and 30 degrees C (59 and 86 degrees F). Keep container tightly closed. Throw away any unused medicine after the expiration date. NOTE: This sheet is a summary. It may not cover all possible information. If you have questions about this medicine, talk to your doctor, pharmacist, or health care provider.  2020 Elsevier/Gold Standard (2018-07-15 12:49:26) Cervical Radiculopathy  Cervical radiculopathy happens when a nerve in the neck (a cervical nerve) is pinched or bruised. This condition can happen because of an injury to the cervical spine (vertebrae) in the neck, or as part of the normal aging process. Pressure on the cervical nerves can cause pain or numbness that travels from the neck all the way down into the arm and fingers. Usually, this condition gets better with rest. Treatment may be needed if the condition does not improve. What are the causes? This condition may be caused by:  A neck injury.  A bulging (herniated) disk.  Muscle spasms.  Muscle tightness in the neck because of overuse.  Arthritis.  Breakdown or  degeneration in the bones and joints of the spine (spondylosis) due to aging.  Bone spurs that may develop near the cervical nerves. What are the signs or symptoms? Symptoms of this condition include:  Pain. The pain may travel from the neck to the arm and hand. The pain can be severe or irritating. It may be worse when you move your neck.  Numbness or tingling in your arm or hand.  Weakness in the affected arm and hand, in severe cases. How is this diagnosed? This condition may be diagnosed based on your symptoms, your medical history, and a physical exam. You may also have tests, including:  X-rays.  A CT scan.  An MRI.  An electromyogram (EMG).  Nerve conduction tests. How is this treated? In many cases, treatment is not needed for this condition. With rest, the condition usually gets better over time. If treatment is needed, options may include:  Wearing a soft neck collar (cervical collar) for short periods of time, as told by your health care provider.  Doing physical therapy to strengthen your neck muscles.  Taking medicines, such as NSAIDs or oral corticosteroids.  Having spinal injections, in severe cases.  Having surgery. This may be needed if other treatments do not help. Different types of surgery may be done depending on the cause of this condition. Follow these instructions at home: If you have a cervical collar:  Wear it as told by your health care provider. Remove it only  as told by your health care provider.  Ask your health care provider if you can remove the collar for cleaning and bathing. If you are allowed to remove the collar for cleaning or bathing: ? Follow instructions from your health care provider about how to remove the collar safely. ? Clean the collar by wiping it with mild soap and water and drying it completely. ? Take out any removable pads in the collar every 1-2 days, and wash them by hand with soap and water. Let them air-dry completely  before you put them back in the collar. ? Check your skin under the collar for irritation or sores. If you see any, tell your health care provider. Managing pain      Take over-the-counter and prescription medicines only as told by your health care provider.  If directed, put ice on the affected area. ? If you have a soft neck collar, remove it as told by your health care provider. ? Put ice in a plastic bag. ? Place a towel between your skin and the bag. ? Leave the ice on for 20 minutes, 2-3 times a day.  If applying ice does not help, you can try using heat. Use the heat source that your health care provider recommends, such as a moist heat pack or a heating pad. ? Place a towel between your skin and the heat source. ? Leave the heat on for 20-30 minutes. ? Remove the heat if your skin turns bright red. This is especially important if you are unable to feel pain, heat, or cold. You may have a greater risk of getting burned.  Try a gentle neck and shoulder massage to help relieve symptoms. Activity  Rest as needed.  Return to your normal activities as told by your health care provider. Ask your health care provider what activities are safe for you.  Do stretching and strengthening exercises as told by your health care provider or physical therapist.  Do not lift anything that is heavier than 10 lb (4.5 kg) until your health care provider tells you that it is safe. General instructions  Use a flat pillow when you sleep.  Do not drive while wearing a cervical collar. If you do not have a cervical collar, ask your health care provider if it is safe to drive while your neck heals.  Ask your health care provider if the medicine prescribed to you requires you to avoid driving or using heavy machinery.  Do not use any products that contain nicotine or tobacco, such as cigarettes, e-cigarettes, and chewing tobacco. These can delay healing. If you need help quitting, ask your health  care provider.  Keep all follow-up visits as told by your health care provider. This is important. Contact a health care provider if:  Your condition does not improve with treatment. Get help right away if:  Your pain gets much worse and cannot be controlled with medicines.  You have weakness or numbness in your hand, arm, face, or leg.  You have a high fever.  You have a stiff, rigid neck.  You lose control of your bowels or your bladder (have incontinence).  You have trouble with walking, balance, or speaking. Summary  Cervical radiculopathy happens when a nerve in the neck is pinched or bruised.  A nerve can get pinched from a bulging disk, arthritis, muscle spasms, or an injury to the neck.  Symptoms include pain, tingling, or numbness radiating from the neck into the arm or hand. Weakness  can also occur in severe cases.  Treatment may include rest, wearing a cervical collar, and physical therapy. Medicines may be prescribed to help with pain. In severe cases, injections or surgery may be needed. This information is not intended to replace advice given to you by your health care provider. Make sure you discuss any questions you have with your health care provider. Document Revised: 07/03/2018 Document Reviewed: 07/03/2018 Elsevier Patient Education  2020 [employer].

## 2019-12-28 NOTE — Progress Notes (Signed)
Provider:  Melvyn Novas, M D  Referring Provider: Leary Roca* Primary Care Physician:  Arvilla Market, DO  Chief Complaint  Patient presents with  . New Patient (Initial Visit) RN Lewie Chamber;    pt alone, rm 10. pt presents today with concerns of muscle twitching/pain. she advises that 2 yrs ago she had a episode of vertigo which she was treated for at that time but since then her body has not been the same. states that she devleoped panic attacks and more recently in the last few months she states that if she does anything excercise wise that exerts her muscle she states that the next day the has horrible pain and weakness and balance is off. she has not had any imaging completed.     HPI:  Melinda Charles is a 54 y.o. female seen upon a referral/ revisit  from Dr. Earlene Plater for balance problems, beginning after a vertigo attack 2 years ago, followed by high levels of anxiety, panic attacks .   She remembered symptoms of eye twitching before the vertigo started, no sinus pain or ear pain. The vertigo onset had been abrupt,she turned to the right ( in bed) and the room started spinning violently- she was severely nauseated. Hardly could walk- and never had this kind of spell again. But the anxiety level contributed to her feeling unsteady , and stress related tension and pain.  She is back to exercise regimen , a cardio routine, but feels excessively sore and has also muscle twitching.   Her neck pain is described as a soreness between the nape of the neck and the shoulder- tension pain.    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  He is 11 and 70 pounds.   Review of Systems: Out of a complete 14 system review, the patient complains of only the following symptoms, and all other reviewed systems are negative.  Twitching, tension,    Social History   Socioeconomic History  . Marital status:  Single    Spouse name: Not on file  . Number of children: Not on file  . Years of education: Not on file  . Highest education level: Not on file  Occupational History  . Not on file  Tobacco Use  . Smoking status: Never Smoker  . Smokeless tobacco: Never Used  Substance and Sexual Activity  . Alcohol use: Never  . Drug use: Never  . Sexual activity: Not on file  Other Topics Concern  . Not on file  Social History Narrative  . Not on file   Social Determinants of Health   Financial Resource Strain:   . Difficulty of Paying Living Expenses:   Food Insecurity:   . Worried About Programme researcher, broadcasting/film/video in the Last Year:   . Barista in the Last Year:   Transportation Needs:   . Freight forwarder (Medical):   Marland Kitchen Lack of Transportation (Non-Medical):   Physical Activity:   . Days of Exercise per Week:   . Minutes of Exercise per Session:   Stress:   . Feeling of Stress :   Social Connections:   . Frequency of Communication with Friends and Family:   . Frequency of Social Gatherings with Friends and Family:   . Attends Religious Services:   . Active Member of Clubs or Organizations:   . Attends Banker Meetings:   .  Marital Status:   Intimate Partner Violence:   . Fear of Current or Ex-Partner:   . Emotionally Abused:   Marland Kitchen Physically Abused:   . Sexually Abused:     Family History  Problem Relation Age of Onset  . Hypertension Mother   . Emphysema Father   . Hypertension Maternal Grandmother   . Diabetes Maternal Grandfather   . Hypertension Paternal Grandmother   . Heart disease Paternal Grandfather   . GER disease Maternal Uncle     Past Medical History:  Diagnosis Date  . Gastroesophageal reflux disease without esophagitis 09/27/2015   Last Assessment & Plan:  2 month history of symptoms Supplies sent to obtain stool sample for H pylori test Trial H2blocker or PPI/reflux precautions Last Assessment & Plan:  2 month history of symptoms  Supplies sent to obtain stool sample for H pylori test Trial H2blocker or PPI/reflux precautions  . History of Helicobacter pylori infection   . Hyperthyroidism   . Varicose veins of leg with pain 04/01/2014   Last Assessment & Plan:  Generalized achiness related to bilateral varicose veins.  Discussed support stockings, extremity elevation, both of which she has done in the past.  She is interested in procedures to alleviate the pain. Last Assessment & Plan:  Generalized achiness related to bilateral varicose veins.  Discussed support stockings, extremity elevation, both of which she has done in the pa    No past surgical history on file.  Current Outpatient Medications  Medication Sig Dispense Refill  . Calcium-Magnesium-Zinc 500-250-12.5 MG TABS Take 1 tablet by mouth every other day.     . Multiple Vitamin (MULTIVITAMIN) tablet Take 1 tablet by mouth daily.    Marland Kitchen tiZANidine (ZANAFLEX) 4 MG tablet Take 1 tablet (4 mg total) by mouth every 8 (eight) hours as needed for muscle spasms. 15 tablet 0   No current facility-administered medications for this visit.    Allergies as of 12/28/2019  . (No Known Allergies)    Vitals: BP 108/64   Pulse 82   Temp (!) 96.9 F (36.1 C)   Ht 5\' 4"  (1.626 m)   Wt 160 lb (72.6 kg)   BMI 27.46 kg/m  Last Weight:  Wt Readings from Last 1 Encounters:  12/28/19 160 lb (72.6 kg)   Last Height:   Ht Readings from Last 1 Encounters:  12/28/19 5\' 4"  (1.626 m)    Physical exam:  General: The patient is awake, alert and appears not in acute distress. The patient is well groomed. Head: Normocephalic, atraumatic. Neck is supple. Cardiovascular:  Regular rate and rhythm , without  murmurs or carotid bruit, and without distended neck veins. Respiratory: Lungs are clear to auscultation. Skin:  Without evidence of edema, or rash Trunk: BMI is 27, and patient  has normal posture.  Neurologic exam : The patient is awake and alert, oriented to place and  time.  Memory subjective  described as intact. There is a normal attention span & concentration ability. Speech is fluent without dysarthria, dysphonia or aphasia. Mood and affect are appropriate.  Cranial nerves: Pupils are equal and briskly reactive to light. Funduscopic exam deferred. Extraocular movements  in vertical and horizontal planes intact and without nystagmus.  Visual fields by finger perimetry are intact. Hearing to finger rub intact.  Facial sensation intact to fine touch. Facial motor strength is symmetric and tongue and uvula move midline. Tongue protrusion into either cheek is normal. Shoulder shrug is normal.   Motor exam: Normal tone ,muscle  bulk and symmetric  strength in all extremities.  Sensory:  Fine touch, pinprick and vibration were tested in all extremities. Proprioception was normal. Coordination: Rapid alternating movements in the fingers/hands were normal. Finger-to-nose maneuver  normal without evidence of ataxia, dysmetria or tremor.  Gait and station: Patient walks without assistive device and is able unassisted to walk down the hallway -Strength within normal limits. Stance is stable and normal. Tandem gait is unfragmented. Romberg testing is  negative   Deep tendon reflexes: in the  upper and lower extremities are symmetric and intact. Babinski maneuver deferred.   Assessment:  After physical and neurologic examination, review of laboratory studies, imaging, neurophysiology testing and pre-existing records, assessment is that of :   Generalized anxiety , hypervigilance   Balance appears to be at normal level - advised hydration, higher protein intake, scheduled snack time.   vegetarian- add multivitamins.   Plan:  Treatment plan and additional workup :  Start a prenatal, drink more water,  Regular sleep and meal times.  Flexaril for night time only.    Porfirio Mylar Jannatul Wojdyla MD 12/28/2019

## 2019-12-28 NOTE — Telephone Encounter (Signed)
medicaid order sent to GI. They will obtain the auth and reach out to the patient to schedule.  °

## 2019-12-29 ENCOUNTER — Telehealth: Payer: Self-pay | Admitting: Neurology

## 2019-12-29 LAB — ELECTROLYTE PANEL
CO2: 25 mmol/L (ref 20–29)
Chloride: 102 mmol/L (ref 96–106)
Potassium: 4.3 mmol/L (ref 3.5–5.2)
Sodium: 141 mmol/L (ref 134–144)

## 2019-12-29 LAB — CK: Total CK: 70 U/L (ref 32–182)

## 2019-12-29 LAB — SEDIMENTATION RATE: Sed Rate: 9 mm/hr (ref 0–40)

## 2019-12-29 NOTE — Telephone Encounter (Signed)
Called the pt and advised of the normal lab results. Pt verbalized understanding. Pt had no questions at this time but was encouraged to call back if questions arise.

## 2019-12-29 NOTE — Progress Notes (Signed)
Normal sed rate, CK, electrolytes.

## 2019-12-29 NOTE — Telephone Encounter (Signed)
-----   Message from Melvyn Novas, MD sent at 12/29/2019 12:41 PM EDT ----- Normal sed rate, CK, electrolytes.

## 2020-01-10 ENCOUNTER — Telehealth: Payer: Self-pay | Admitting: Neurology

## 2020-01-10 ENCOUNTER — Other Ambulatory Visit: Payer: Medicaid Other

## 2020-01-10 NOTE — Telephone Encounter (Signed)
Will need to refer to PT for tension neck pain- if the pain doesn't improve we can order Ct again,.  Medicaid guidelines: 1. One of the following must be met. - Follow up contact (in person, by phone, by mail, or by messaging) with your doctor confirmed a failed recent (within three months) six week trial of doctor prescribed treatment. Supported treatments include (but are not limited to) medications for swelling or pain, physical therapy, and/or oral or injected steroids. -You have a sign or symptom that suggests a serious underlying condition for which a trial of treatment is not needed.  CD

## 2020-01-10 NOTE — Telephone Encounter (Signed)
Pt called to discuss other CT options as her CT was denied by medicaid and was unsure what the next step will be

## 2020-01-10 NOTE — Telephone Encounter (Signed)
Medicaid did not approve the CT.  Denial Reason:   Based on [employer] Guidelines Section(s): SP 3.1 Neck (Cervical Spine) Pain without  and with Neurological Features (Including Stenosis) and 1.0 General Guidelines, we cannot  approve this request. Your records show that you have neck pain. The reason this request cannot be approved is because: 1. One of the following must be met. - Follow up contact (in person, by phone, by mail, or by messaging) with your doctor confirmed a failed recent (within three months) six week trial of doctor prescribed treatment. Supported treatments include (but are not limited to) medications for swelling or pain, physical therapy, and/or oral or injected steroids. -You have a sign or symptom that suggests a serious underlying condition for which a trial of treatment is not needed.    The case number is [id number]  The phone number for the peer to peer is [phone]  It does need to be scheduled and it would need to be scheduled by Friday 01/14/20.

## 2020-01-10 NOTE — Addendum Note (Signed)
Addended by: Melvyn Novas on: 01/10/2020 05:53 PM   Modules accepted: Orders

## 2020-01-18 ENCOUNTER — Other Ambulatory Visit: Payer: Medicaid Other

## 2020-01-27 ENCOUNTER — Ambulatory Visit: Payer: Medicaid Other | Admitting: Physical Therapy

## 2020-03-01 ENCOUNTER — Ambulatory Visit: Payer: Medicaid Other | Attending: Neurology | Admitting: Physical Therapy

## 2020-03-01 ENCOUNTER — Other Ambulatory Visit: Payer: Self-pay

## 2020-03-01 ENCOUNTER — Encounter: Payer: Self-pay | Admitting: Physical Therapy

## 2020-03-01 VITALS — BP 124/67 | HR 64

## 2020-03-01 DIAGNOSIS — M542 Cervicalgia: Secondary | ICD-10-CM | POA: Insufficient documentation

## 2020-03-01 NOTE — Therapy (Addendum)
[facility] [address] [place], [place], [address] Phone: [phone]   Fax:  [phone]  Physical Therapy Evaluation  Patient Details  Name: Melinda Charles MRN: [id number] Date of Birth: 25-Feb-1966 Referring Provider (PT): Dr Porfirio Mylar Dohmeier    Encounter Date: 03/01/2020   PT End of Session - 03/01/20 1248    Visit Number 1    Number of Visits 4    Date for PT Re-Evaluation 04/12/20    Authorization Type Medicaid    Authorization - Visit Number 1    Authorization - Number of Visits 4    PT Start Time 0800    PT Stop Time 0845    PT Time Calculation (min) 45 min    Activity Tolerance Patient tolerated treatment well    Behavior During Therapy [facility] for tasks assessed/performed           Past Medical History:  Diagnosis Date  . Gastroesophageal reflux disease without esophagitis 09/27/2015   Last Assessment & Plan:  2 month history of symptoms Supplies sent to obtain stool sample for H pylori test Trial H2blocker or PPI/reflux precautions Last Assessment & Plan:  2 month history of symptoms Supplies sent to obtain stool sample for H pylori test Trial H2blocker or PPI/reflux precautions  . History of Helicobacter pylori infection   . Hyperthyroidism   . Varicose veins of leg with pain 04/01/2014   Last Assessment & Plan:  Generalized achiness related to bilateral varicose veins.  Discussed support stockings, extremity elevation, both of which she has done in the past.  She is interested in procedures to alleviate the pain. Last Assessment & Plan:  Generalized achiness related to bilateral varicose veins.  Discussed support stockings, extremity elevation, both of which she has done in the pa    History reviewed. No pertinent surgical history.  Vitals:   03/01/20 0820 03/01/20 0829  BP: 127/69 124/67  Pulse: (!) 59 64  SpO2: 98%       Subjective Assessment - 03/01/20 0805    Subjective Tow years ago the patient began  having vertigo. She had treatment for the vertigo but since that point she has had an unbalanced feeling when she walks or exercises. She is also having panic attacks 2x per day. When she has her panic attacks her neck tightnes up and hshe has increased pian. She can not think of anything that triggers her panick attacks they just come on. She feels like she gets hot and flushedbefore she has her panic attacks.    Pertinent History panic attacks,    How long can you sit comfortably? No limit    How long can you stand comfortably? no limit    How long can you walk comfortably? 20-30 minutes    Diagnostic tests Nothing    Patient Stated Goals to be able to exercise again    Currently in Pain? Yes    Pain Score 8     Pain Location Neck    Pain Orientation Right    Pain Descriptors / Indicators Aching    Pain Type Chronic pain    Pain Radiating Towards pain radiating into bilateral shoulders    Pain Onset 1 to 4 weeks ago    Pain Frequency Intermittent    Aggravating Factors  walking, exercises    Pain Relieving Factors stretching,    Effect of Pain on Daily Activities unable to exercise              [facility]  PT Assessment - 03/01/20 0001      Assessment   Medical Diagnosis Syncope/ Neck Pain anad stiffness    Referring Provider (PT) Dr Porfirio Mylar Dohmeier     Onset Date/Surgical Date --   2 years prior    Next MD Visit August    Prior Therapy for her vertigo       Precautions   Precautions None      Restrictions   Weight Bearing Restrictions No      Balance Screen   Has the patient fallen in the past 6 months No    Has the patient had a decrease in activity level because of a fear of falling?  No    Is the patient reluctant to leave their home because of a fear of falling?  No      Prior Function   Level of Independence Independent    Vocation Unemployed    Leisure exercises,       Cognition   Overall Cognitive Status Within Functional Limits for tasks assessed    Attention  Focused    Focused Attention Appears intact    Memory Appears intact    Awareness Appears intact    Problem Solving Appears intact      Observation/Other Assessments   Focus on Therapeutic Outcomes (FOTO)  mediciad       Sensation   Light Touch Appears Intact    Additional Comments Denies parathesias       Coordination   Gross Motor Movements are Fluid and Coordinated Yes    Fine Motor Movements are Fluid and Coordinated Yes      Posture/Postural Control   Posture/Postural Control No significant limitations      ROM / Strength   AROM / PROM / Strength AROM;PROM;Strength      AROM   Overall AROM Comments full active ROM of shoulders     AROM Assessment Site Cervical    Cervical Flexion 20     Cervical Extension 18     Cervical - Right Rotation 45    Cervical - Left Rotation 40      Strength   Strength Assessment Site Shoulder;Hand    Right/Left Shoulder Right;Left    Right Shoulder Flexion 4+/5    Right Shoulder Internal Rotation 5/5    Right Shoulder External Rotation 4+/5    Left Shoulder Flexion 4+/5    Left Shoulder Internal Rotation 5/5    Left Shoulder External Rotation 5/5    Right/Left hand Right;Left    Right Hand Grip (lbs) 20    Left Hand Grip (lbs) 40      Palpation   Palpation comment significant spasming of bilateral upper traps       Ambulation/Gait   Gait Comments mild lateral movement with gait. No syncope. patient ambualted 555'       High Level Balance   High Level Balance Comments assess narrow base, tandem stance, single leg stance                       Objective measurements completed on examination: See above findings.       [facility] Adult PT Treatment/Exercise - 03/01/20 0001      Manual Therapy   Manual therapy comments gentle trigger point release in supine; genetle sub-occiptial release. Upon istting up the patient became syncopal                  PT Education - 03/01/20 1247  Education Details importance  of improving cervical motion    Person(s) Educated Patient    Methods Explanation;Demonstration;Tactile cues;Verbal cues    Comprehension Returned demonstration;Verbalized understanding;Verbal cues required;Tactile cues required            PT Short Term Goals - 03/01/20 1301      PT SHORT TERM GOAL #1   Title Patient will increase bilateral cervical rotation by 15 degrees    Baseline 45 l 40 right    Time 4    Period Weeks    Status New    Target Date 03/22/20      PT SHORT TERM GOAL #2   Title Patient will increase gross bilateral shoulder flexion to 5/5    Baseline 4+/5 bilateral shoulder flexion; bilateral er 4+/5    Time 4    Period Weeks    Status New    Target Date 03/29/20      PT SHORT TERM GOAL #3   Title Patient will ambualte 1000' without increased syncope    Baseline 350' with mild syncope    Time 4    Period Weeks    Status New    Target Date 03/29/20             PT Long Term Goals - 03/01/20 1304      PT LONG TERM GOAL #1   Title Patient will ambulate for exercise for 30 minutes without significant syncope    Baseline can only ambulate 15-20 minutes before syncope    Time 6    Period Weeks    Status New    Target Date 04/12/20      PT LONG TERM GOAL #2   Title Patient will increase vervical ROM to WNL without syncope    Baseline L 45 right 40 rotation/ increased flexion 20 extension 18    Time 6    Period Weeks    Status New    Target Date 04/12/20                  Plan - 03/01/20 1047    Clinical Impression Statement Patient is a 54 year old female who presents with decreased balance and increased neck pain which is exacebrated with cervical movement. She has frequent panic attack (1-2 a day). She has limited cervical rotation and flexion/ extension. She has significant tightness in her upper traps, She had a negative vertebral artery test. She has increased syncope when she ambulates 20-30 minutes. She ambualted 3 minutes today and  her vitals remained consistnet. She has minor strength deficits bilateral. She had what she felt like was an on comining panic attack with light soft tissue mobilization. Therapy let her lie down in a dark room and it helped the panic attack pass. She would benefit from skilled therapy to reduce muscle tightness, increase range, and hopefully reduce syncope with activity. Her goal is to be able to exercise agian.    Personal Factors and Comorbidities Past/Current Experience;Time since onset of injury/illness/exacerbation;Comorbidity 1;Comorbidity 2    Comorbidities vertigo, panic attacks, anxiety    Examination-Activity Limitations Locomotion Level   drive   Examination-Participation Restrictions Community Activity;Shop;Driving;Laundry    Stability/Clinical Decision Making Unstable/Unpredictable    Clinical Decision Making High    Rehab Potential Fair    PT Frequency 1x / week   unclear etiology of syncope.   PT Duration 6 weeks    PT Treatment/Interventions ADLs/Self Care Home Management;Traction;Electrical Stimulation;Cryotherapy;Functional mobility training;Therapeutic activities;Therapeutic exercise;Patient/family education;Manual techniques;Passive range of motion;Taping;Dry  needling    PT Next Visit Plan assess tolerance to manual therapy. Attmept again maybe for a shorter duration in a different position, consider dry needling but patient has anxiety problems, light postural corrections. Condier 6-10 monut walk to look for blood pressure or heart rate abnormalities    PT Home Exercise Plan therapy reviewed use of thera-cane but no futher x-rays given 2nd to potential onset of syncope    Consulted and Agree with Plan of Care Patient           Patient will benefit from skilled therapeutic intervention in order to improve the following deficits and impairments:     Visit Diagnosis: Cervicalgia     Problem List Patient Active Problem List   Diagnosis Date Noted  . Hyperthyroidism  10/23/2018  . Special screening for malignant neoplasms, colon 02/11/2018  . History of Helicobacter pylori infection 02/11/2018  . Palpitations 01/22/2018  . Unsteadiness 12/30/2017  . Gastroesophageal reflux disease without esophagitis 09/27/2015  . Varicose veins of leg with pain 04/01/2014   Ms.   Check all possible CPT codes:      [x]  97110 (Therapeutic Exercise)  []  92507 (SLP Treatment)  [x]  97112 (Neuro Re-ed)   []  92526 (Swallowing Treatment)   [x]  97116 (Gait Training)   []  [id number] (Cognitive Training, 1st 15 minutes) [x]  97140 (Manual Therapy)   []  97130 (Cognitive Training, each add'l 15 minutes)  [x]  97530 (Therapeutic Activities)  []  Other, List CPT Code ____________    [x]  97535 (Self Care)       [x]  All codes above (97110 - 97535)  []  97012 (Mechanical Traction)  [x]  1610997014 (E-stim Unattended)  []  97032 (E-stim manual)  []  97033 (Ionto)  [x]  97035 (Ultrasound)  []  97016 (Vaso)  []  97760 (Orthotic Fit) []  [id number] (Prosthetic Training) []  [id number] (Physical Performance Training) []  [id number] (Aquatic Therapy) []  [id number] (Canalith Repositioning) []  [id number] (Contrast Bath) []  [id number] (Paraffin) []  97597 (Wound Care 1st 20 sq cm) []  97598 (Wound Care each add'l 20 sq cm)     Dessie Comaavid J Lajoya Dombek PT DPT  03/01/2020, 1:24 PM  [facility] [address] [place], [place], [address] Phone: [phone]   Fax:  [phone]  Name: Danielle Rankinobia White-Winfield MRN: [id number] Date of Birth: 12/11/1965

## 2020-03-01 NOTE — Addendum Note (Signed)
Addended by: Dessie Coma on: 03/01/2020 01:25 PM   Modules accepted: Orders

## 2020-03-20 ENCOUNTER — Ambulatory Visit: Payer: Medicaid Other | Admitting: Physical Therapy

## 2020-03-27 ENCOUNTER — Telehealth: Payer: Self-pay | Admitting: Physical Therapy

## 2020-03-27 ENCOUNTER — Ambulatory Visit: Payer: Medicaid Other | Admitting: Physical Therapy

## 2020-03-27 NOTE — Telephone Encounter (Signed)
Patient contacted regarding missed visit. Patient had cancelled last week 2nd to delta variant but did not cancel all visits. Therapy will cancel visits for the time being. She was advised therapy will cancel further appointments unless she contacts [place]. She was advised to call back ASAP if she wants to keep her appointment for next week.

## 2020-04-03 ENCOUNTER — Encounter: Payer: Medicaid Other | Admitting: Physical Therapy

## 2020-04-04 ENCOUNTER — Ambulatory Visit: Payer: Medicaid Other | Admitting: Family Medicine

## 2020-05-30 ENCOUNTER — Encounter: Payer: Self-pay | Admitting: Physical Therapy

## 2020-05-30 ENCOUNTER — Other Ambulatory Visit: Payer: Self-pay

## 2020-05-30 ENCOUNTER — Ambulatory Visit: Payer: Medicaid Other | Attending: Neurology | Admitting: Physical Therapy

## 2020-05-30 DIAGNOSIS — M542 Cervicalgia: Secondary | ICD-10-CM | POA: Insufficient documentation

## 2020-05-31 ENCOUNTER — Encounter: Payer: Self-pay | Admitting: Physical Therapy

## 2020-05-31 NOTE — Therapy (Signed)
[facility] [address] [place], [place], [address] Phone: [phone]   Fax:  [phone]  Physical Therapy Treatment  Patient Details  Name: Melinda Charles MRN: [id number] Date of Birth: 02-02-1966 Referring Provider (PT): Dr Porfirio Mylararmen Dohmeier    Encounter Date: 05/30/2020   PT End of Session - 05/30/20 1348    Visit Number 1    Number of Visits 6    Date for PT Re-Evaluation 07/11/20    PT Start Time 1330    PT Stop Time 1412    PT Time Calculation (min) 42 min    Activity Tolerance Patient tolerated treatment well    Behavior During Therapy [facility] for tasks assessed/performed           Past Medical History:  Diagnosis Date  . Gastroesophageal reflux disease without esophagitis 09/27/2015   Last Assessment & Plan:  2 month history of symptoms Supplies sent to obtain stool sample for H pylori test Trial H2blocker or PPI/reflux precautions Last Assessment & Plan:  2 month history of symptoms Supplies sent to obtain stool sample for H pylori test Trial H2blocker or PPI/reflux precautions  . History of Helicobacter pylori infection   . Hyperthyroidism   . Varicose veins of leg with pain 04/01/2014   Last Assessment & Plan:  Generalized achiness related to bilateral varicose veins.  Discussed support stockings, extremity elevation, both of which she has done in the past.  She is interested in procedures to alleviate the pain. Last Assessment & Plan:  Generalized achiness related to bilateral varicose veins.  Discussed support stockings, extremity elevation, both of which she has done in the pa    History reviewed. No pertinent surgical history.  There were no vitals filed for this visit.   Subjective Assessment - 05/30/20 1335    Subjective Patient returns feeling like her neck is about the same as when she was seen a few months ago. She has not been seen since that point because of corcerns over covid. She continues to have  pain in her upper abck and shoulders. The pain can vary as to whether it is in the morning or the night.    Pertinent History panic attacks,    How long can you sit comfortably? No limit    How long can you stand comfortably? no limit    How long can you walk comfortably? 20-30 minutes    Diagnostic tests Nothing    Patient Stated Goals to be able to exercise again    Currently in Pain? Yes    Pain Score 8     Pain Location Neck    Pain Orientation Right    Pain Descriptors / Indicators Aching    Pain Type Chronic pain    Pain Radiating Towards radiates into the bilateral shoulders    Pain Onset 1 to 4 weeks ago    Pain Frequency Intermittent    Aggravating Factors  walking, exercises    Pain Relieving Factors stretches    Effect of Pain on Daily Activities unable to ecxericise    Multiple Pain Sites No              [facility] PT Assessment - 05/31/20 0001      Assessment   Medical Diagnosis Syncope/ Neck Pain anad stiffness    Referring Provider (PT) Dr Porfirio Mylararmen Dohmeier     Onset Date/Surgical Date --   2 years    Hand Dominance Right    Next MD Visit Novemeber  2nd     Prior Therapy for her vertigo       Precautions   Precautions None      Restrictions   Weight Bearing Restrictions No      Balance Screen   Has the patient fallen in the past 6 months No    Has the patient had a decrease in activity level because of a fear of falling?  No    Is the patient reluctant to leave their home because of a fear of falling?  No      Prior Function   Level of Independence Independent    Vocation Unemployed    Leisure likes to exercise       Cognition   Overall Cognitive Status Within Functional Limits for tasks assessed    Attention Focused    Focused Attention Appears intact    Memory Appears intact    Awareness Appears intact    Problem Solving Appears intact      Observation/Other Assessments   Focus on Therapeutic Outcomes (FOTO)  mediciad       Sensation   Light  Touch Appears Intact      Coordination   Gross Motor Movements are Fluid and Coordinated Yes    Fine Motor Movements are Fluid and Coordinated Yes      Posture/Postural Control   Posture/Postural Control No significant limitations      AROM   Cervical Flexion 23    Cervical Extension 30    Cervical - Right Rotation 55    Cervical - Left Rotation 55      Strength   Right Shoulder Flexion 4+/5    Right Shoulder Internal Rotation 5/5    Right Shoulder External Rotation 4+/5    Left Shoulder Flexion 4+/5    Left Shoulder Internal Rotation 5/5    Left Shoulder External Rotation 4+/5    Right Hand Grip (lbs) 20    Left Hand Grip (lbs) 20      Palpation   Palpation comment significant spasming of bilateral upper traps                          [facility] Adult PT Treatment/Exercise - 05/31/20 0001      Neck Exercises: Standing   Other Standing Exercises scap retraction x10 yellow       Neck Exercises: Stretches   Upper Trapezius Stretch 2 reps;20 seconds;Right;Left    Levator Stretch 2 reps;20 seconds;Right;Left                  PT Education - 05/30/20 1340    Education Details benefits of stretching and strengthening    Person(s) Educated Patient    Methods Explanation;Demonstration;Tactile cues;Verbal cues    Comprehension Verbalized understanding;Returned demonstration;Verbal cues required;Tactile cues required            PT Short Term Goals - 05/31/20 1639      PT SHORT TERM GOAL #1   Title Patient will increase bilateral cervical rotation by 10 degrees    Baseline bilateral    Time 4    Period Weeks    Status Revised    Target Date 06/21/20      PT SHORT TERM GOAL #2   Title Patient will increase gross bilateral shoulder flexion to 5/5    Baseline 4+/5    Time 4    Period Weeks    Target Date 06/21/20      PT SHORT TERM GOAL #  3   Title Patient will ambualte 1000' without increased syncope    Baseline 350' with mild syncope no change  from intial visit    Time 3    Period Weeks    Status On-going    Target Date 06/21/20             PT Long Term Goals - 05/31/20 1641      PT LONG TERM GOAL #1   Title Patient will ambulate for exercise for 30 minutes without significant syncope    Baseline can only ambulate 15-20 minutes before syncope    Time 6    Period Weeks    Status On-going    Target Date 06/28/20      PT LONG TERM GOAL #2   Title Patient will increase cervical ROM to WNL without syncope    Baseline continues syncope with movement    Time 6    Period Weeks    Status On-going      PT LONG TERM GOAL #3   Baseline \                 Plan - 05/31/20 1633    Clinical Impression Statement Patient has not been seen since intial eval 2nd to concerners about the delta variant. Her symptoms continue to be consitent. She continues to have limited ceruvcal movement, limited bilateral UE strength, and syncope with head movements. She has frequent panic attacks. At times she has 1-2 panic attacks a day. She reports an " odd feeling like she is floating" when she turns her head. She has spasming in her upper traps and peri-scapualr area. She would benefit from skilled therapy to reduce ccervical symptoms, improve motion, and improve patients ability to perfrom ADL's.    Personal Factors and Comorbidities Past/Current Experience;Time since onset of injury/illness/exacerbation;Comorbidity 1;Comorbidity 2    Comorbidities vertigo, panic attacks, anxiety    Examination-Activity Limitations Locomotion Level    Examination-Participation Restrictions Community Activity;Shop;Driving;Laundry    Stability/Clinical Decision Making Unstable/Unpredictable    Clinical Decision Making High    Rehab Potential Fair    PT Frequency 1x / week    PT Duration 6 weeks    PT Treatment/Interventions ADLs/Self Care Home Management;Traction;Electrical Stimulation;Cryotherapy;Functional mobility training;Therapeutic  activities;Therapeutic exercise;Patient/family education;Manual techniques;Passive range of motion;Taping;Dry needling    PT Next Visit Plan cotninue with manual stretching and trigger point release. The patient is interested in needling.continue to slowly add light exercises.    PT Home Exercise Plan therapy reviewed use of thera-cane but no futher x-rays given 2nd to potential onset of syncope    Consulted and Agree with Plan of Care Patient           Patient will benefit from skilled therapeutic intervention in order to improve the following deficits and impairments:     Visit Diagnosis: Cervicalgia    Check all possible CPT codes:      []  97110 (Therapeutic Exercise)  []  92507 (SLP Treatment)  []  97112 (Neuro Re-ed)   []  92526 (Swallowing Treatment)   []  97116 (Gait Training)   []  (Cognitive Training, 1st 15 minutes) []  97140 (Manual Therapy)   []  97130 (Cognitive Training, each add'l 15 minutes)  []  97530 (Therapeutic Activities)  []  Other, List CPT Code ____________    []  (Self Care)       [x]  All codes above (97110 - 97535)  []  97012 (Mechanical Traction)  []  97014 (E-stim Unattended)  []  97032 (E-stim manual)  []  97033 (Ionto)  []   31540 (Ultrasound)  []  97016 (Vaso)  []  [phone] ) []  [phone] (Prosthetic Training) []  Furniture conservator/restorer (Physical Performance Training) []  (Aquatic Therapy) []  [phone] (Canalith Repositioning) []  (Contrast Bath) []  [id number] (Paraffin) []  (Wound Care 1st 20 sq cm) []  97598 (Wound Care each add'l 20 sq cm)     Problem List Patient Active Problem List   Diagnosis Date Noted  . Hyperthyroidism 10/23/2018  . Special screening for malignant neoplasms, colon 02/11/2018  . History of Helicobacter pylori infection 02/11/2018  . Palpitations 01/22/2018  . Unsteadiness 12/30/2017  . Gastroesophageal reflux disease without esophagitis 09/27/2015  . Varicose veins of leg with pain 04/01/2014    PT DPT    05/31/2020, 4:43 PM  [facility] [address] [place], 02/13/2018, 01/24/2018 Phone: [phone]   Fax:  [phone]  Name: Renae Mottley MRN: Dessie Coma Date of Birth: 1966/03/30

## 2020-05-31 NOTE — Patient Instructions (Signed)
Access Code: [facility] URL: [email or web address] Date: 05/31/2020 Prepared by: Lorayne Bender  Exercises .Gentle Levator Scapulae Stretch - 1 x daily - 7 x weekly - 1 sets - 3 reps - 20 hold .Seated Upper Trapezius Stretch - 1 x daily - 7 x weekly - 1 sets - 3 reps - 20 hold .Scapular Retraction with Resistance - 2 x daily - 7 x weekly - 1 sets - 10 reps

## 2020-06-13 ENCOUNTER — Other Ambulatory Visit: Payer: Self-pay

## 2020-06-13 ENCOUNTER — Telehealth (INDEPENDENT_AMBULATORY_CARE_PROVIDER_SITE_OTHER): Payer: Medicaid Other | Admitting: Internal Medicine

## 2020-06-13 DIAGNOSIS — R42 Dizziness and giddiness: Secondary | ICD-10-CM

## 2020-06-13 DIAGNOSIS — R2689 Other abnormalities of gait and mobility: Secondary | ICD-10-CM

## 2020-06-13 MED ORDER — MECLIZINE HCL 50 MG PO TABS: 50.0000 mg | ORAL_TABLET | Freq: Three times a day (TID) | ORAL | 1 refills | Status: DC | PRN

## 2020-06-13 NOTE — Progress Notes (Signed)
Virtual Visit via Telephone Note  I connected with Caraline White-Winfield, on 06/13/2020 at 2:17 PM by telephone due to the COVID-19 pandemic and verified that I am speaking with the correct person using two identifiers.   Consent: I discussed the limitations, risks, security and privacy concerns of performing an evaluation and management service by telephone and the availability of in person appointments. I also discussed with the patient that there may be a patient responsible charge related to this service. The patient expressed understanding and agreed to proceed.   Location of Patient: Home   Location of Provider: Clinic    Persons participating in Telemedicine visit: Temari White-Winfield Charolotte Eke Dr. Earlene Plater      History of Present Illness: Patient has a visit for concerns of vertigo. She was referred to neurology in March 2021. Neurology found her exam to benign and did not recommend any further imaging/work up. She is being followed by PT. Reports that symptoms have not changed. Mostly occurs at night where when she lays down she feels like she is floating. During daytime she experiences symptoms when she goes for walk or after lifting something over her head. Does have a lot of neck stiffness for which she has been seeing PT. This makes her feel "off balanced". She does think this may be related to anxiety.    Past Medical History:  Diagnosis Date  . Anxiety    Phreesia 06/13/2020  . Gastroesophageal reflux disease without esophagitis 09/27/2015   Last Assessment & Plan:  2 month history of symptoms Supplies sent to obtain stool sample for H pylori test Trial H2blocker or PPI/reflux precautions Last Assessment & Plan:  2 month history of symptoms Supplies sent to obtain stool sample for H pylori test Trial H2blocker or PPI/reflux precautions  . History of Helicobacter pylori infection   . Hyperthyroidism   . Varicose veins of leg with pain 04/01/2014   Last Assessment &  Plan:  Generalized achiness related to bilateral varicose veins.  Discussed support stockings, extremity elevation, both of which she has done in the past.  She is interested in procedures to alleviate the pain. Last Assessment & Plan:  Generalized achiness related to bilateral varicose veins.  Discussed support stockings, extremity elevation, both of which she has done in the pa   No Known Allergies  Current Outpatient Medications on File Prior to Visit  Medication Sig Dispense Refill  . Multiple Vitamin (MULTIVITAMIN) tablet Take 1 tablet by mouth daily.     No current facility-administered medications on file prior to visit.    Observations/Objective: NAD. Speaking clearly.  Work of breathing normal.  Alert and oriented. Mood appropriate.   Assessment and Plan: 1. Vertigo 2. Balance problem Reassuring, patient has been evaluated by neurology and found to have a benign exam. Attributed symptoms to patient being hypervigilant and suffering from anxiety. Will treat with Meclizine at nighttime. Will refer to vestibular rehab. If no improvement with these treatments, could consider MRI brain for prolonged vertigo but do not believe necessary at this point.  - meclizine (ANTIVERT) 50 MG tablet; Take 1 tablet (50 mg total) by mouth 3 (three) times daily as needed.  Dispense: 90 tablet; Refill: 1 - PT vestibular rehab; Future - Ambulatory referral to Physical Therapy   Follow Up Instructions: PRN and for routine medical care    I discussed the assessment and treatment plan with the patient. The patient was provided an opportunity to ask questions and all were answered. The patient agreed with  the plan and demonstrated an understanding of the instructions.   The patient was advised to call back or seek an in-person evaluation if the symptoms worsen or if the condition fails to improve as anticipated.     I provided 22 minutes total of non-face-to-face time during this encounter including  median intraservice time, reviewing previous notes, investigations, ordering medications, medical decision making, coordinating care and patient verbalized understanding at the end of the visit.    Marcy Siren, D.O. [facility] at [facility]  06/13/2020, 2:17 PM

## 2020-06-15 ENCOUNTER — Other Ambulatory Visit: Payer: Self-pay

## 2020-06-15 ENCOUNTER — Ambulatory Visit: Payer: Medicaid Other | Admitting: Physical Therapy

## 2020-06-15 DIAGNOSIS — M542 Cervicalgia: Secondary | ICD-10-CM | POA: Diagnosis not present

## 2020-06-16 ENCOUNTER — Encounter: Payer: Self-pay | Admitting: Physical Therapy

## 2020-06-16 NOTE — Therapy (Signed)
[facility] [address] [place], [place], [address] Phone: [phone]   Fax:  [phone]  Physical Therapy Treatment  Patient Details  Name: Melinda Charles MRN: [id number] Date of Birth: [birth year] Referring Provider (PT): Dr Porfirio Mylar Dohmeier    Encounter Date: 06/15/2020   PT End of Session - 06/16/20 0827    Visit Number 2    Number of Visits 6    Date for PT Re-Evaluation 07/11/20    Authorization Type Medicaid    Authorization - Visit Number 2    Authorization - Number of Visits 4    PT Start Time 1545    PT Stop Time 1626    PT Time Calculation (min) 41 min    Activity Tolerance Patient tolerated treatment well    Behavior During Therapy [facility] for tasks assessed/performed           Past Medical History:  Diagnosis Date  . Anxiety    Phreesia 06/13/2020  . Gastroesophageal reflux disease without esophagitis 09/27/2015   Last Assessment & Plan:  2 month history of symptoms Supplies sent to obtain stool sample for H pylori test Trial H2blocker or PPI/reflux precautions Last Assessment & Plan:  2 month history of symptoms Supplies sent to obtain stool sample for H pylori test Trial H2blocker or PPI/reflux precautions  . History of Helicobacter pylori infection   . Hyperthyroidism   . Varicose veins of leg with pain 04/01/2014   Last Assessment & Plan:  Generalized achiness related to bilateral varicose veins.  Discussed support stockings, extremity elevation, both of which she has done in the past.  She is interested in procedures to alleviate the pain. Last Assessment & Plan:  Generalized achiness related to bilateral varicose veins.  Discussed support stockings, extremity elevation, both of which she has done in the pa    History reviewed. No pertinent surgical history.  There were no vitals filed for this visit.   Subjective Assessment - 06/16/20 0821    Subjective Patient continues to report everything is about  the same. She is having syncope and spasming in both shoulders. sh ehas a script for vestibular rehab.    Pertinent History panic attacks,    How long can you sit comfortably? No limit    How long can you stand comfortably? no limit    How long can you walk comfortably? 20-30 minutes    Diagnostic tests Nothing    Patient Stated Goals to be able to exercise again    Currently in Pain? Yes    Pain Score 6     Pain Location Neck    Pain Orientation Right;Left    Pain Descriptors / Indicators Aching    Pain Type Chronic pain    Pain Radiating Towards with movement    Pain Onset 1 to 4 weeks ago    Pain Frequency Intermittent    Aggravating Factors  walking and exercises    Pain Relieving Factors stretches    Effect of Pain on Daily Activities unable    Multiple Pain Sites No                             [facility] Adult PT Treatment/Exercise - 06/16/20 0001      Neck Exercises: Seated   Other Seated Exercise bilateral er 2x8 ylellow; bilattersl horizontal abdcution 2x10 yellow       Manual Therapy   Manual Therapy Soft tissue mobilization;Myofascial release;Manual Traction  Soft tissue mobilization cervical paraspinales, UT, STM    Manual Traction genlte manual traction and sub-occipital relase. Improved tolerance this visit       Neck Exercises: Stretches   Upper Trapezius Stretch 2 reps;20 seconds;Right;Left    Levator Stretch 2 reps;20 seconds;Right;Left                  PT Education - 06/16/20 0826    Education Details benefits of exercise and Soft ttissue mobilization    Person(s) Educated Patient    Methods Explanation;Demonstration;Tactile cues;Verbal cues    Comprehension Verbalized understanding;Returned demonstration;Verbal cues required;Tactile cues required            PT Short Term Goals - 05/31/20 1639      PT SHORT TERM GOAL #1   Title Patient will increase bilateral cervical rotation by 10 degrees    Baseline bilateral    Time 4     Period Weeks    Status Revised    Target Date 06/21/20      PT SHORT TERM GOAL #2   Title Patient will increase gross bilateral shoulder flexion to 5/5    Baseline 4+/5    Time 4    Period Weeks    Target Date 06/21/20      PT SHORT TERM GOAL #3   Title Patient will ambualte 1000' without increased syncope    Baseline 350' with mild syncope no change from intial visit    Time 3    Period Weeks    Status On-going    Target Date 06/21/20             PT Long Term Goals - 05/31/20 1641      PT LONG TERM GOAL #1   Title Patient will ambulate for exercise for 30 minutes without significant syncope    Baseline can only ambulate 15-20 minutes before syncope    Time 6    Period Weeks    Status On-going    Target Date 06/28/20      PT LONG TERM GOAL #2   Title Patient will increase cervical ROM to WNL without syncope    Baseline continues syncope with movement    Time 6    Period Weeks    Status On-going      PT LONG TERM GOAL #3   Baseline \                 Plan - 06/16/20 0831    Clinical Impression Statement Patient has significant tightness in her upper traps. Therapy perfromed manual therapy and patient had decreased tightness and improved rotation. she was advised to continue to maintain movement at home. She was given light ther-ex. She had a minor increase in pain with ther-ex. She was advised to do what she can at home but to listent o her neck. She has a script for vestibular rehab.    Personal Factors and Comorbidities Past/Current Experience;Time since onset of injury/illness/exacerbation;Comorbidity 1;Comorbidity 2    Comorbidities vertigo, panic attacks, anxiety    Examination-Activity Limitations Locomotion Level    Examination-Participation Restrictions Community Activity;Shop;Driving;Laundry    Stability/Clinical Decision Making Unstable/Unpredictable    Clinical Decision Making High    Rehab Potential Fair    PT Frequency 1x / week    PT  Duration 6 weeks    PT Treatment/Interventions ADLs/Self Care Home Management;Traction;Electrical Stimulation;Cryotherapy;Functional mobility training;Therapeutic activities;Therapeutic exercise;Patient/family education;Manual techniques;Passive range of motion;Taping;Dry needling    PT Next Visit Plan cotninue with manual stretching  and trigger point release. The patient is interested in needling.continue to slowly add light exercises.    PT Home Exercise Plan therapy reviewed use of thera-cane but no futher x-rays given 2nd to potential onset of syncope    Consulted and Agree with Plan of Care Patient           Patient will benefit from skilled therapeutic intervention in order to improve the following deficits and impairments:     Visit Diagnosis: Cervicalgia     Problem List Patient Active Problem List   Diagnosis Date Noted  . Hyperthyroidism 10/23/2018  . Special screening for malignant neoplasms, colon 02/11/2018  . History of Helicobacter pylori infection 02/11/2018  . Palpitations 01/22/2018  . Unsteadiness 12/30/2017  . Gastroesophageal reflux disease without esophagitis 09/27/2015  . Varicose veins of leg with pain 04/01/2014    Dessie Coma 06/16/2020, 8:38 AM  [facility] [address] [place], [place], [address] Phone: [phone]   Fax:  [phone]  Name: Melinda Charles MRN: [id number] Date of Birth: May 16, 1966

## 2020-06-22 ENCOUNTER — Other Ambulatory Visit: Payer: Self-pay

## 2020-06-22 ENCOUNTER — Ambulatory Visit: Payer: Medicaid Other | Admitting: Physical Therapy

## 2020-06-22 DIAGNOSIS — M542 Cervicalgia: Secondary | ICD-10-CM | POA: Diagnosis not present

## 2020-06-23 ENCOUNTER — Encounter: Payer: Self-pay | Admitting: Physical Therapy

## 2020-06-23 NOTE — Therapy (Signed)
[facility] [address] [place], [place], [address] Phone: [phone]   Fax:  [phone]  Physical Therapy Treatment  Patient Details  Name: Melinda Charles MRN: [id number] Date of Birth: [birth year] Referring Provider (PT): Dr Porfirio Mylar Dohmeier    Encounter Date: 06/22/2020   PT End of Session - 06/23/20 0813    Visit Number 3    Number of Visits 6    Date for PT Re-Evaluation 07/11/20    Authorization Type Medicaid    Authorization - Visit Number 3    Authorization - Number of Visits 4    PT Start Time 1415    PT Stop Time 1457    PT Time Calculation (min) 42 min    Activity Tolerance Patient tolerated treatment well    Behavior During Therapy [facility] for tasks assessed/performed           Past Medical History:  Diagnosis Date  . Anxiety    Phreesia 06/13/2020  . Gastroesophageal reflux disease without esophagitis 09/27/2015   Last Assessment & Plan:  2 month history of symptoms Supplies sent to obtain stool sample for H pylori test Trial H2blocker or PPI/reflux precautions Last Assessment & Plan:  2 month history of symptoms Supplies sent to obtain stool sample for H pylori test Trial H2blocker or PPI/reflux precautions  . History of Helicobacter pylori infection   . Hyperthyroidism   . Varicose veins of leg with pain 04/01/2014   Last Assessment & Plan:  Generalized achiness related to bilateral varicose veins.  Discussed support stockings, extremity elevation, both of which she has done in the past.  She is interested in procedures to alleviate the pain. Last Assessment & Plan:  Generalized achiness related to bilateral varicose veins.  Discussed support stockings, extremity elevation, both of which she has done in the pa    History reviewed. No pertinent surgical history.  There were no vitals filed for this visit.   Subjective Assessment - 06/23/20 0807    Subjective Patient reports her neck pain has been a little  better. She has been using her thera-cane. She feels like there is a corelation between her syncope and her trigger points. She has been doing her exercises with no significant increase in pain.    Currently in Pain? Yes    Pain Score 5     Pain Location Neck    Pain Orientation Right;Left    Pain Descriptors / Indicators Aching    Pain Type Chronic pain    Pain Onset 1 to 4 weeks ago    Pain Frequency Intermittent    Aggravating Factors  walking and exercises    Pain Relieving Factors stretching    Multiple Pain Sites No                             [facility] Adult PT Treatment/Exercise - 06/23/20 0001      Neck Exercises: Standing   Other Standing Exercises scap retraction 2x10 red; shoulder extension 2x10 red       Neck Exercises: Seated   Other Seated Exercise bilateral er 2x8 red; bilattersl horizontal abdcution 2x10 yellow       Manual Therapy   Manual Therapy Soft tissue mobilization;Myofascial release;Manual Traction    Soft tissue mobilization cervical paraspinales, UT, STM; IASTYM to upper trap;     Manual Traction genlte manual traction and sub-occipital relase. Improved tolerance this visit       Neck  Exercises: Stretches   Upper Trapezius Stretch 2 reps;20 seconds;Right;Left    Levator Stretch 2 reps;20 seconds;Right;Left                  PT Education - 06/23/20 0808    Education Details updated HEP; increased bands    Person(s) Educated Patient    Methods Demonstration;Tactile cues;Verbal cues;Explanation    Comprehension Verbalized understanding;Returned demonstration;Verbal cues required;Tactile cues required            PT Short Term Goals - 05/31/20 1639      PT SHORT TERM GOAL #1   Title Patient will increase bilateral cervical rotation by 10 degrees    Baseline bilateral    Time 4    Period Weeks    Status Revised    Target Date 06/21/20      PT SHORT TERM GOAL #2   Title Patient will increase gross bilateral shoulder  flexion to 5/5    Baseline 4+/5    Time 4    Period Weeks    Target Date 06/21/20      PT SHORT TERM GOAL #3   Title Patient will ambualte 1000' without increased syncope    Baseline 350' with mild syncope no change from intial visit    Time 3    Period Weeks    Status On-going    Target Date 06/21/20             PT Long Term Goals - 05/31/20 1641      PT LONG TERM GOAL #1   Title Patient will ambulate for exercise for 30 minutes without significant syncope    Baseline can only ambulate 15-20 minutes before syncope    Time 6    Period Weeks    Status On-going    Target Date 06/28/20      PT LONG TERM GOAL #2   Title Patient will increase cervical ROM to WNL without syncope    Baseline continues syncope with movement    Time 6    Period Weeks    Status On-going      PT LONG TERM GOAL #3   Baseline \                 Plan - 06/23/20 0818    Clinical Impression Statement Patient had decreased spasming in her upper traps this visit. Therapy continues to focus on soft titssue mobilization and progressive ther-ex. Therapy was able to progress to red bands with no increase in pain. The patient has a script for vestibular rehab. Therapy will continue until she is transfered to neuro for vestibular evaluation    Personal Factors and Comorbidities Past/Current Experience;Time since onset of injury/illness/exacerbation;Comorbidity 1;Comorbidity 2    Comorbidities vertigo, panic attacks, anxiety    Examination-Activity Limitations Locomotion Level    Examination-Participation Restrictions Community Activity;Shop;Driving;Laundry    Stability/Clinical Decision Making Unstable/Unpredictable    Rehab Potential Fair    PT Frequency 1x / week    PT Duration 6 weeks    PT Treatment/Interventions ADLs/Self Care Home Management;Traction;Electrical Stimulation;Cryotherapy;Functional mobility training;Therapeutic activities;Therapeutic exercise;Patient/family education;Manual  techniques;Passive range of motion;Taping;Dry needling    PT Next Visit Plan cotninue with manual stretching and trigger point release. The patient is interested in needling.continue to slowly add light exercises.    PT Home Exercise Plan therapy reviewed use of thera-cane but no futher x-rays given 2nd to potential onset of syncope    Consulted and Agree with Plan of Care Patient  Patient will benefit from skilled therapeutic intervention in order to improve the following deficits and impairments:     Visit Diagnosis: Cervicalgia     Problem List Patient Active Problem List   Diagnosis Date Noted  . Hyperthyroidism 10/23/2018  . Special screening for malignant neoplasms, colon 02/11/2018  . History of Helicobacter pylori infection 02/11/2018  . Palpitations 01/22/2018  . Unsteadiness 12/30/2017  . Gastroesophageal reflux disease without esophagitis 09/27/2015  . Varicose veins of leg with pain 04/01/2014    Dessie Coma PT DPT  06/23/2020, 8:52 AM  [facility] [address] [place], [place], [address] Phone: [phone]   Fax:  [phone]  Name: Taren Dymek MRN: [id number] Date of Birth: 07-07-1966

## 2020-06-27 ENCOUNTER — Ambulatory Visit: Payer: Medicaid Other | Admitting: Family Medicine

## 2020-06-27 ENCOUNTER — Encounter: Payer: Self-pay | Admitting: Family Medicine

## 2020-06-27 ENCOUNTER — Other Ambulatory Visit: Payer: Self-pay

## 2020-06-27 VITALS — BP 103/61 | HR 67 | Ht 64.0 in | Wt 166.0 lb

## 2020-06-27 DIAGNOSIS — G44219 Episodic tension-type headache, not intractable: Secondary | ICD-10-CM | POA: Diagnosis not present

## 2020-06-27 DIAGNOSIS — M542 Cervicalgia: Secondary | ICD-10-CM | POA: Diagnosis not present

## 2020-06-27 DIAGNOSIS — R2681 Unsteadiness on feet: Secondary | ICD-10-CM | POA: Diagnosis not present

## 2020-06-27 NOTE — Progress Notes (Signed)
Chief Complaint  Patient presents with  . Follow-up    rm 16  . Headache    pt still has the pain in the neck and upper back.     HISTORY OF PRESENT ILLNESS: Today 06/27/20  Melinda Charles is a 54 y.o. female here today for follow up for neck pain and balance difficulty. She continues to have neck and upper back pain. Pain waxes and wanes. She describes a tension/tightness that is worse with exercise/activity. MRI cervical spine was not approved. She reports that she was told she has to complete workup with PCP first. She reports that cyclobenzaprine does not help. She has tried other muscle relaxers in the past that were not helpful. She feels that when in pain, she is off balance. She denies dizziness. She feels as if she just isn't stable on her feet. No falls or syncope spells. She has infrequent vertiginous episodes when she lies in the bed. Meclizine started with PCP can help.  She denies radicular symptoms. She feels anxious during these events and endorses palpitations. She continues to work with physical therapy. This is helping some but seems to provide temporary relief. She has been scared to exercise due to having more pain following previous workouts. She admits to worsening anxiety. She is concerned that maybe this contributes to symptoms. When she feels more anxious, neck pain worsens. 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 She has to assist with ADL's daily.    HISTORY (copied from Dr Dohmeier's note on 12/28/2019)  HPI:  Melinda Charles is a 54 y.o. female seen upon a referral/ revisit  from Dr. Earlene PlaterWallace for balance problems, beginning after a vertigo attack 2 years ago, followed by high levels of anxiety, panic attacks .   She remembered symptoms of eye twitching before the vertigo started, no sinus pain or ear pain. The vertigo onset had been abrupt,she turned to the right ( in bed) and the room started spinning violently- she  was severely nauseated. Hardly could walk- and never had this kind of spell again. But the anxiety level contributed to her feeling unsteady , and stress related tension and pain.  She is back to exercise regimen , a cardio routine, but feels excessively sore and has also muscle twitching.   Her neck pain is described as a soreness between the nape of the neck and the shoulder- tension pain.    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  He is 11 and 70 pounds.      REVIEW OF SYSTEMS: Out of a complete 14 system review of symptoms, the patient complains only of the following symptoms, neck pain, back pain, intermittent vertigo, intermittent headaches, imbalance, anxiety and all other reviewed systems are negative.   ALLERGIES: No Known Allergies   HOME MEDICATIONS: Outpatient Medications Prior to Visit  Medication Sig Dispense Refill  . meclizine (ANTIVERT) 50 MG tablet Take 1 tablet (50 mg total) by mouth 3 (three) times daily as needed. 90 tablet 1  . Multiple Vitamin (MULTIVITAMIN) tablet Take 1 tablet by mouth daily.     No facility-administered medications prior to visit.     PAST MEDICAL HISTORY: Past Medical History:  Diagnosis Date  . Anxiety    Phreesia 06/13/2020  . Gastroesophageal reflux disease without esophagitis 09/27/2015   Last Assessment & Plan:  2 month history of symptoms Supplies sent  to obtain stool sample for H pylori test Trial H2blocker or PPI/reflux precautions Last Assessment & Plan:  2 month history of symptoms Supplies sent to obtain stool sample for H pylori test Trial H2blocker or PPI/reflux precautions  . History of Helicobacter pylori infection   . Hyperthyroidism   . Varicose veins of leg with pain 04/01/2014   Last Assessment & Plan:  Generalized achiness related to bilateral varicose veins.  Discussed support stockings, extremity elevation, both of which she has done in the past.  She is  interested in procedures to alleviate the pain. Last Assessment & Plan:  Generalized achiness related to bilateral varicose veins.  Discussed support stockings, extremity elevation, both of which she has done in the pa     PAST SURGICAL HISTORY: History reviewed. No pertinent surgical history.   FAMILY HISTORY: Family History  Problem Relation Age of Onset  . Hypertension Mother   . Emphysema Father   . Hypertension Maternal Grandmother   . Diabetes Maternal Grandfather   . Hypertension Paternal Grandmother   . Heart disease Paternal Grandfather   . GER disease Maternal Uncle      SOCIAL HISTORY: Social History   Socioeconomic History  . Marital status: Single    Spouse name: Not on file  . Number of children: Not on file  . Years of education: Not on file  . Highest education level: Not on file  Occupational History  . Not on file  Tobacco Use  . Smoking status: Never Smoker  . Smokeless tobacco: Never Used  Substance and Sexual Activity  . Alcohol use: Never  . Drug use: Never  . Sexual activity: Not on file  Other Topics Concern  . Not on file  Social History Narrative  . Not on file   Social Determinants of Health   Financial Resource Strain:   . Difficulty of Paying Living Expenses: Not on file  Food Insecurity:   . Worried About Programme researcher, broadcasting/film/video in the Last Year: Not on file  . Ran Out of Food in the Last Year: Not on file  Transportation Needs:   . Lack of Transportation (Medical): Not on file  . Lack of Transportation (Non-Medical): Not on file  Physical Activity:   . Days of Exercise per Week: Not on file  . Minutes of Exercise per Session: Not on file  Stress:   . Feeling of Stress : Not on file  Social Connections:   . Frequency of Communication with Friends and Family: Not on file  . Frequency of Social Gatherings with Friends and Family: Not on file  . Attends Religious Services: Not on file  . Active Member of Clubs or Organizations: Not  on file  . Attends Banker Meetings: Not on file  . Marital Status: Not on file  Intimate Partner Violence:   . Fear of Current or Ex-Partner: Not on file  . Emotionally Abused: Not on file  . Physically Abused: Not on file  . Sexually Abused: Not on file      PHYSICAL EXAM  Vitals:   06/27/20 1358  BP: 103/61  Pulse: 67  Weight: 166 lb (75.3 kg)  Height: 5\' 4"  (1.626 m)   Body mass index is 28.49 kg/m.   Generalized: Well developed, in no acute distress   Cardiology: Normal rate and rhythm, no murmur auscultated Respiratory: Clear to auscultation bilaterally  Neurological examination  Mentation: Alert oriented to time, place, history taking. Follows all commands speech and language  fluent Cranial nerve II-XII: Pupils were equal round reactive to light. Extraocular movements were full, visual field were full on confrontational test. Facial sensation and strength were normal. Head turning and shoulder shrug  were normal and symmetric. Motor: The motor testing reveals 5 over 5 strength of all 4 extremities. Good symmetric motor tone is noted throughout.  Sensory: Sensory testing is intact to soft touch on all 4 extremities. No evidence of extinction is noted.  Coordination: Cerebellar testing reveals good finger-nose-finger and heel-to-shin bilaterally.  Gait and station: Gait is normal.  Reflexes: Deep tendon reflexes are symmetric and normal bilaterally.     DIAGNOSTIC DATA (LABS, IMAGING, TESTING) - I reviewed patient records, labs, notes, testing and imaging myself where available.  Lab Results  Component Value Date   WBC 6.2 11/24/2019   HGB 12.7 11/24/2019   HCT 38.1 11/24/2019   MCV 95 11/24/2019   PLT 267 11/24/2019      Component Value Date/Time   NA 141 12/28/2019 1513   K 4.3 12/28/2019 1513   CL 102 12/28/2019 1513   CO2 25 12/28/2019 1513   GLUCOSE 108 (H) 11/24/2019 1619   GLUCOSE 101 (H) 01/21/2018 0311   BUN 13 11/24/2019 1619    CREATININE 0.63 11/24/2019 1619   CALCIUM 9.5 11/24/2019 1619   PROT 7.2 11/24/2019 1619   ALBUMIN 4.7 11/24/2019 1619   AST 18 11/24/2019 1619   ALT 10 11/24/2019 1619   ALKPHOS 72 11/24/2019 1619   BILITOT 0.2 11/24/2019 1619   GFRNONAA 103 11/24/2019 1619   GFRAA 119 11/24/2019 1619   Lab Results  Component Value Date   CHOL 199 11/24/2019   HDL 66 11/24/2019   LDLCALC 114 (H) 11/24/2019   TRIG 105 11/24/2019   CHOLHDL 3.0 11/24/2019   No results found for: HGBA1C No results found for: VITAMINB12 Lab Results  Component Value Date   TSH 1.860 10/12/2019      ASSESSMENT AND PLAN  54 y.o. year old female  has a past medical history of Anxiety, Gastroesophageal reflux disease without esophagitis (09/27/2015), History of Helicobacter pylori infection, Hyperthyroidism, and Varicose veins of leg with pain (04/01/2014). here with   Cervicalgia  Episodic tension-type headache, not intractable  Unsteadiness  Janis reports that neck and upper back pain continue to be a persistent concern for her.  MRI of cervical spine was not approved as she has to complete 6 weeks of physical therapy prior to imaging.  She is currently participating with physical therapy and does feel that symptoms are improved temporarily.  She has not had any previous imaging of her neck.  She has not found much relief with previous use of muscle relaxers.  Ibuprofen does help symptoms.  We have discussed concerns of musculoskeletal strain as she is having to assist in all ADLs of her 107 year old son with Down syndrome.  She endorses increasing anxiety following vertiginous episode last year.  Fortunately vertigo has not returned in such severity.  She does feel that meclizine helps.  May consider anxiety/depression treatment pending completion of physical therapy and imaging.  May consider pain management with trial meloxicam.  She will discuss with primary care.  She was encouraged to continue healthy lifestyle  habits.  Adequate hydration, well-balanced diet and regular physical activity encouraged.  She will follow-up with [place] as needed pending completion of physical therapy and MRI.  She verbalizes understanding and agreement with this plan.   I spent 25 minutes of face-to-face and non-face-to-face time with patient.  This included previsit chart review, lab review, study review, order entry, electronic health record documentation, patient education.    Shawnie Dapper, MSN, FNP-C 06/27/2020, 2:55 PM  [facility] [address] [place], [place] [address] [phone]

## 2020-06-27 NOTE — Patient Instructions (Addendum)
Below is our plan:  We will continue monitor symptoms. May continue cyclobenzaprine if it helps. May use ibuprofen as needed. Consider talking with your PCP regarding use of meloxicam for pain management. Insurance will not approve cervical spine MRI until PT completed for 6 weeks. You can call us when you are ready to have MRI cervical spine. May consider discussion with PCP regarding use of anxiety/depression medications to see if this will help as you endorse much more anxiety since having vertigo episode.   Please make sure you are staying well hydrated. I recommend 50-60 ounces daily. Well balanced diet and regular exercise encouraged.    Please continue follow up with care team as directed.   Follow up pending response to PT, continued workup with PCP and MRI results.   You may receive a survey regarding today's visit. I encourage you to leave honest feed back as I do use this information to improve patient care. Thank you for seeing me today!       Neck Exercises Ask your health care provider which exercises are safe for you. Do exercises exactly as told by your health care provider and adjust them as directed. It is normal to feel mild stretching, pulling, tightness, or discomfort as you do these exercises. Stop right away if you feel sudden pain or your pain gets worse. Do not begin these exercises until told by your health care provider. Neck exercises can be important for many reasons. They can improve strength and maintain flexibility in your neck, which will help your upper back and prevent neck pain. Stretching exercises Rotation neck stretching  1. Sit in a chair or stand up. 2. Place your feet flat on the floor, shoulder width apart. 3. Slowly turn your head (rotate) to the right until a slight stretch is felt. Turn it all the way to the right so you can look over your right shoulder. Do not tilt or tip your head. 4. Hold this position for 10-30 seconds. 5. Slowly turn your  head (rotate) to the left until a slight stretch is felt. Turn it all the way to the left so you can look over your left shoulder. Do not tilt or tip your head. 6. Hold this position for 10-30 seconds. Repeat __________ times. Complete this exercise __________ times a day. Neck retraction 1. Sit in a sturdy chair or stand up. 2. Look straight ahead. Do not bend your neck. 3. Use your fingers to push your chin backward (retraction). Do not bend your neck for this movement. Continue to face straight ahead. If you are doing the exercise properly, you will feel a slight sensation in your throat and a stretch at the back of your neck. 4. Hold the stretch for 1-2 seconds. Repeat __________ times. Complete this exercise __________ times a day. Strengthening exercises Neck press 1. Lie on your back on a firm bed or on the floor with a pillow under your head. 2. Use your neck muscles to push your head down on the pillow and straighten your spine. 3. Hold the position as well as you can. Keep your head facing up (in a neutral position) and your chin tucked. 4. Slowly count to 5 while holding this position. Repeat __________ times. Complete this exercise __________ times a day. Isometrics These are exercises in which you strengthen the muscles in your neck while keeping your neck still (isometrics). 1. Sit in a supportive chair and place your hand on your forehead. 2. Keep your head and  face facing straight ahead. Do not flex or extend your neck while doing isometrics. 3. Push forward with your head and neck while pushing back with your hand. Hold for 10 seconds. 4. Do the sequence again, this time putting your hand against the back of your head. Use your head and neck to push backward against the hand pressure. 5. Finally, do the same exercise on either side of your head, pushing sideways against the pressure of your hand. Repeat __________ times. Complete this exercise __________ times a day. Prone  head lifts 1. Lie face-down (prone position), resting on your elbows so that your chest and upper back are raised. 2. Start with your head facing downward, near your chest. Position your chin either on or near your chest. 3. Slowly lift your head upward. Lift until you are looking straight ahead. Then continue lifting your head as far back as you can comfortably stretch. 4. Hold your head up for 5 seconds. Then slowly lower it to your starting position. Repeat __________ times. Complete this exercise __________ times a day. Supine head lifts 1. Lie on your back (supine position), bending your knees to point to the ceiling and keeping your feet flat on the floor. 2. Lift your head slowly off the floor, raising your chin toward your chest. 3. Hold for 5 seconds. Repeat __________ times. Complete this exercise __________ times a day. Scapular retraction 1. Stand with your arms at your sides. Look straight ahead. 2. Slowly pull both shoulders (scapulae) backward and downward (retraction) until you feel a stretch between your shoulder blades in your upper back. 3. Hold for 10-30 seconds. 4. Relax and repeat. Repeat __________ times. Complete this exercise __________ times a day. Contact a health care provider if:  Your neck pain or discomfort gets much worse when you do an exercise.  Your neck pain or discomfort does not improve within 2 hours after you exercise. If you have any of these problems, stop exercising right away. Do not do the exercises again unless your health care provider says that you can. Get help right away if:  You develop sudden, severe neck pain. If this happens, stop exercising right away. Do not do the exercises again unless your health care provider says that you can. This information is not intended to replace advice given to you by your health care provider. Make sure you discuss any questions you have with your health care provider. Document Revised: 06/10/2018  Document Reviewed: 06/10/2018 Elsevier Patient Education  2020 [employer].

## 2020-07-05 ENCOUNTER — Telehealth: Payer: Self-pay

## 2020-07-05 NOTE — Telephone Encounter (Addendum)
Patient contacted the office to notify  provider of the completion of six weeks of physical therapy. Patient shared there has not been improvement in the condition. Patient requested these comments be shared with the neurologist.

## 2020-07-06 ENCOUNTER — Ambulatory Visit: Payer: Medicaid Other | Attending: Neurology | Admitting: Physical Therapy

## 2020-07-06 ENCOUNTER — Other Ambulatory Visit: Payer: Self-pay

## 2020-07-06 DIAGNOSIS — M542 Cervicalgia: Secondary | ICD-10-CM | POA: Insufficient documentation

## 2020-07-07 ENCOUNTER — Encounter: Payer: Self-pay | Admitting: Physical Therapy

## 2020-07-07 NOTE — Therapy (Signed)
[facility] [address] [place], [place], [address] Phone: [phone]   Fax:  [phone]  Physical Therapy Treatment  Patient Details  Name: Melinda Charles MRN: [id number] Date of Birth: 02-14-1966 Referring Provider (PT): Dr Porfirio Mylar Dohmeier    Encounter Date: 07/06/2020   PT End of Session - 07/06/20 1422    Visit Number 4    Number of Visits 6    Date for PT Re-Evaluation 07/11/20    Authorization Type Medicaid    Authorization - Visit Number 4    Authorization - Number of Visits 4    PT Start Time 1415    PT Stop Time 1458    PT Time Calculation (min) 43 min    Activity Tolerance Patient tolerated treatment well    Behavior During Therapy [facility] for tasks assessed/performed           Past Medical History:  Diagnosis Date  . Anxiety    Phreesia 06/13/2020  . Gastroesophageal reflux disease without esophagitis 09/27/2015   Last Assessment & Plan:  2 month history of symptoms Supplies sent to obtain stool sample for H pylori test Trial H2blocker or PPI/reflux precautions Last Assessment & Plan:  2 month history of symptoms Supplies sent to obtain stool sample for H pylori test Trial H2blocker or PPI/reflux precautions  . History of Helicobacter pylori infection   . Hyperthyroidism   . Varicose veins of leg with pain 04/01/2014   Last Assessment & Plan:  Generalized achiness related to bilateral varicose veins.  Discussed support stockings, extremity elevation, both of which she has done in the past.  She is interested in procedures to alleviate the pain. Last Assessment & Plan:  Generalized achiness related to bilateral varicose veins.  Discussed support stockings, extremity elevation, both of which she has done in the pa    History reviewed. No pertinent surgical history.  There were no vitals filed for this visit.   Subjective Assessment - 07/07/20 1109    Subjective Patient reports the neck feels better after  therapy and may be a little llooser but it is overall about the same. She continues to be syncopal after afew hours.    Pertinent History panic attacks,    How long can you sit comfortably? No limit    How long can you stand comfortably? no limit    How long can you walk comfortably? 20-30 minutes    Diagnostic tests Nothing    Patient Stated Goals to be able to exercise again    Currently in Pain? Yes    Pain Score 5     Pain Location Neck    Pain Orientation Right;Left    Pain Descriptors / Indicators Aching    Pain Type Chronic pain    Pain Radiating Towards with movement    Pain Onset 1 to 4 weeks ago    Pain Frequency Intermittent    Aggravating Factors  walking and exercises    Pain Relieving Factors stretching    Effect of Pain on Daily Activities unable                             [facility] Adult PT Treatment/Exercise - 07/07/20 0001      Neck Exercises: Standing   Other Standing Exercises scap retraction 2x10 red; shoulder extension 2x10 red       Neck Exercises: Seated   Other Seated Exercise bilateral er 2x10 red; bilateral horizontal abdcution  2x10 yellow       Manual Therapy   Manual Therapy Soft tissue mobilization;Myofascial release;Manual Traction    Soft tissue mobilization cervical paraspinales, UT, STM; IASTYM to upper trap;     Manual Traction genlte manual traction and sub-occipital relase. Improved tolerance this visit       Neck Exercises: Stretches   Upper Trapezius Stretch 2 reps;20 seconds;Right;Left    Levator Stretch 2 reps;20 seconds;Right;Left                  PT Education - 07/06/20 1436    Education Details reviewed HEP and syptom magement    Person(s) Educated Patient    Methods Explanation;Demonstration;Tactile cues;Verbal cues    Comprehension Verbalized understanding;Returned demonstration;Verbal cues required;Tactile cues required            PT Short Term Goals - 05/31/20 1639      PT SHORT TERM GOAL #1    Title Patient will increase bilateral cervical rotation by 10 degrees    Baseline bilateral    Time 4    Period Weeks    Status Revised    Target Date 06/21/20      PT SHORT TERM GOAL #2   Title Patient will increase gross bilateral shoulder flexion to 5/5    Baseline 4+/5    Time 4    Period Weeks    Target Date 06/21/20      PT SHORT TERM GOAL #3   Title Patient will ambualte 1000' without increased syncope    Baseline 350' with mild syncope no change from intial visit    Time 3    Period Weeks    Status On-going    Target Date 06/21/20             PT Long Term Goals - 05/31/20 1641      PT LONG TERM GOAL #1   Title Patient will ambulate for exercise for 30 minutes without significant syncope    Baseline can only ambulate 15-20 minutes before syncope    Time 6    Period Weeks    Status On-going    Target Date 06/28/20      PT LONG TERM GOAL #2   Title Patient will increase cervical ROM to WNL without syncope    Baseline continues syncope with movement    Time 6    Period Weeks    Status On-going      PT LONG TERM GOAL #3   Baseline \                 Plan - 07/06/20 1437    Clinical Impression Statement Patient continues to have spasming in her upper traps but it appears to have improved. She has been working on her stretches and exercises athome as much as possible. Therapy re-assessed paiteits motion today. it is about the same. Therapy also assessed strength which has improved. Overall she feels like her muscles are looser but when she exercises she has increased syncope. She would like to be put on hold until she has her imigaing done and she goes through her vestibular therapy.    Personal Factors and Comorbidities Past/Current Experience;Time since onset of injury/illness/exacerbation;Comorbidity 1;Comorbidity 2    Examination-Activity Limitations Locomotion Level    Examination-Participation Restrictions Community Activity;Shop;Driving;Laundry     Stability/Clinical Decision Making Unstable/Unpredictable    Clinical Decision Making High    Rehab Potential Fair    PT Frequency 1x / week    PT Duration  6 weeks    PT Treatment/Interventions ADLs/Self Care Home Management;Traction;Electrical Stimulation;Cryotherapy;Functional mobility training;Therapeutic activities;Therapeutic exercise;Patient/family education;Manual techniques;Passive range of motion;Taping;Dry needling    PT Next Visit Plan cotninue with manual stretching and trigger point release. The patient is interested in needling.continue to slowly add light exercises.    PT Home Exercise Plan therapy reviewed use of thera-cane but no futher x-rays given 2nd to potential onset of syncope    Consulted and Agree with Plan of Care Patient           Patient will benefit from skilled therapeutic intervention in order to improve the following deficits and impairments:     Visit Diagnosis: Cervicalgia     Problem List Patient Active Problem List   Diagnosis Date Noted  . Hyperthyroidism 10/23/2018  . Special screening for malignant neoplasms, colon 02/11/2018  . History of Helicobacter pylori infection 02/11/2018  . Palpitations 01/22/2018  . Unsteadiness 12/30/2017  . Gastroesophageal reflux disease without esophagitis 09/27/2015  . Varicose veins of leg with pain 04/01/2014    Dessie Coma PT DPT  07/07/2020, 11:16 AM  [facility] [address] [place], [place], [address] Phone: [phone]   Fax:  [phone]  Name: Melinda Charles MRN: [id number] Date of Birth: 06/25/1966

## 2020-07-18 ENCOUNTER — Ambulatory Visit: Payer: Medicaid Other

## 2020-07-18 DIAGNOSIS — F411 Generalized anxiety disorder: Secondary | ICD-10-CM | POA: Diagnosis not present

## 2020-07-18 DIAGNOSIS — M542 Cervicalgia: Secondary | ICD-10-CM | POA: Insufficient documentation

## 2020-07-26 ENCOUNTER — Ambulatory Visit: Payer: Medicaid Other | Attending: Neurology

## 2020-07-26 ENCOUNTER — Other Ambulatory Visit: Payer: Self-pay

## 2020-07-26 DIAGNOSIS — R293 Abnormal posture: Secondary | ICD-10-CM | POA: Insufficient documentation

## 2020-07-26 DIAGNOSIS — R2681 Unsteadiness on feet: Secondary | ICD-10-CM | POA: Diagnosis not present

## 2020-07-26 DIAGNOSIS — M542 Cervicalgia: Secondary | ICD-10-CM | POA: Insufficient documentation

## 2020-07-26 DIAGNOSIS — R42 Dizziness and giddiness: Secondary | ICD-10-CM | POA: Diagnosis not present

## 2020-07-26 NOTE — Therapy (Signed)
[facility] [address] [place], [place], [address] Phone: [phone]   Fax:  [phone]  Physical Therapy Evaluation  Patient Details  Name: Melinda Rankinobia White-Winfield MRN: [id number] Date of Birth: 02-08-1966 Referring Provider (PT): Arvilla Marketatherine Lauren Wallace, DO   Encounter Date: 07/26/2020   PT End of Session - 07/26/20 0935    Visit Number 1    Number of Visits 7    Date for PT Re-Evaluation --   POC for 6 weeks. Re-Eval at 7th Visit   Authorization Type BCBS Medicaid (VL: 27, Auth Required)    Authorization Time Period waiting authorization approval    PT Start Time [phone]   pt arriving late   PT Stop Time 0931    PT Time Calculation (min) 39 min    Activity Tolerance Patient tolerated treatment well    Behavior [facility] Therapy [facility] for tasks assessed/performed           Past Medical History:  Diagnosis Date  . Anxiety    Phreesia 06/13/2020  . Gastroesophageal reflux disease without esophagitis 09/27/2015   Last Assessment & Plan:  2 month history of symptoms Supplies sent to obtain stool sample for H pylori test Trial H2blocker or PPI/reflux precautions Last Assessment & Plan:  2 month history of symptoms Supplies sent to obtain stool sample for H pylori test Trial H2blocker or PPI/reflux precautions  . History of Helicobacter pylori infection   . Hyperthyroidism   . Varicose veins of leg with pain 04/01/2014   Last Assessment & Plan:  Generalized achiness related to bilateral varicose veins.  Discussed support stockings, extremity elevation, both of which she has done in the past.  She is interested in procedures to alleviate the pain. Last Assessment & Plan:  Generalized achiness related to bilateral varicose veins.  Discussed support stockings, extremity elevation, both of which she has done in the pa    History reviewed. No pertinent surgical history.  There were no vitals filed for this visit.    Subjective  Assessment - 07/26/20 0854    Subjective [facility]. Patient reports when neck pain intensifies that she feels off balance. Patient also reports that sometimes the pain radiates down the right/left arm. Patient also reports that when she goes to lay down she has a spinning sensation, reports does not last short duration. Patient reports that se does have tight/tense muscle pain on both sides of the neck that occurs as flare up. Patient reports no falls but does feel imbalanced at times. Patient reports that this has been going on about two years, was seen at this clinic for prior vertigo episode.    Pertinent History Anxiety, GERD, Hyperthyroidism, Vertigo    Limitations Standing;Walking    Patient Stated Goals get rid of the dizziness, feel more balanced    Currently in Pain? No/denies              [facility] Assessment - 07/26/20 0901      Assessment   Medical Diagnosis Vertigo/Imbalance, Neck Pain    Referring Provider (PT) Arvilla Marketatherine Lauren Wallace, DO    Onset Date/Surgical Date --   2 years prior   Hand Dominance Right    Prior Therapy [address] [facility] OPPT for Cervicalgia      Precautions   Precautions None      Restrictions   Weight Bearing Restrictions No      Balance Screen   Has the  patient fallen in the past 6 months No    Has the patient had a decrease in activity level because of a fear of falling?  Yes   has reduced exercise   Is the patient reluctant to leave their home because of a fear of falling?  No      Home Tourist information centre manager residence    Research officer, trade union;Children    Available Help at Discharge Family    Type of Home House    Home Access Level entry    Home Layout Two level    Alternate Level Stairs-Number of Steps 12-14    Alternate Level Stairs-Rails Left    Home Equipment None      Prior Function   Level of Independence Independent    Vocation Unemployed     Vocation Requirements Stay at [employer]    Leisure Likes to Exercise      Cognition   Overall Cognitive Status Within Functional Limits for tasks assessed      Sensation   Light Touch Appears Intact      Posture/Postural Control   Posture/Postural Control Postural limitations    Postural Limitations Rounded Shoulders;Forward head      Strength   Overall Strength Within functional limits for tasks performed    Overall Strength Comments Grossly 5/5 on BUE    Strength Assessment Site Hip;Knee;Ankle    Right/Left Hip Right;Left    Right Hip Flexion 4+/5    Right Hip ABduction 4+/5    Right Hip ADduction 5/5    Left Hip Flexion 5/5    Left Hip ABduction 4+/5    Left Hip ADduction 5/5    Right/Left Knee Right;Left    Right Knee Flexion 5/5    Right Knee Extension 4+/5    Left Knee Flexion 5/5    Left Knee Extension 4+/5    Right/Left Ankle Right;Left    Right Ankle Dorsiflexion 5/5    Left Ankle Dorsiflexion 5/5      Palpation   Palpation comment increased muscle tension in bilateral upper traps, B rhomboids, B cervical paraspinals, and B suboccipitals.       Transfers   Transfers Sit to Stand;Stand to Sit    Sit to Stand 7: Independent    Stand to Sit 7: Independent    Comments no instances of imbalance noted with sit <> stand      Ambulation/Gait   Ambulation/Gait Yes    Ambulation/Gait Assistance 7: Independent    Ambulation Distance (Feet) 100 Feet    Assistive device None    Gait Pattern Within Functional Limits    Ambulation Surface Level;Indoor    Gait Comments No instances of imbalance, mild veering at times but able to maintain balance without assistance from PT                  Vestibular Assessment - 07/26/20 0001      Symptom Behavior   Subjective history of current problem Patient reports prior episode of BPPV years ago. Patient reports that she has been primarily having neck pain, reports when neck pain intensifies that she feels off balance.  Reports that she has had a couple instances of spinning sensation with bed mobility, but has been mild. Patient reports overall imbalance, but no falls.     Type of Dizziness  Imbalance;Spinning;Unsteady with head/body turns    Frequency of Dizziness with completion of movements    Duration of Dizziness depends on the movement; sometimes  short duration, other times longer duration.     Symptom Nature Motion provoked;Positional    Aggravating Factors Activity in general;Turning body quickly;Lying supine;Turning head quickly    Relieving Factors Head stationary;Slow movements    Progression of Symptoms Worse    History of similar episodes Reports prior episode of BPPV      Oculomotor Exam   Oculomotor Alignment Normal    Ocular ROM WFL    Spontaneous Absent    Gaze-induced  Absent    Smooth Pursuits Intact    Saccades Intact      Oculomotor Exam-Fixation Suppressed    Left Head Impulse Normal    Right Head Impulse Normal      Visual Acuity   Static Line 8    Dynamic Line 7       Positional Testing   Dix-Hallpike Dix-Hallpike Right;Dix-Hallpike Left    Horizontal Canal Testing Horizontal Canal Right;Horizontal Canal Left      Dix-Hallpike Right   Dix-Hallpike Right Duration none    Dix-Hallpike Right Symptoms No nystagmus      Dix-Hallpike Left   Dix-Hallpike Left Duration mild sensation of dizziness    Dix-Hallpike Left Symptoms No nystagmus      Horizontal Canal Right   Horizontal Canal Right Duration none    Horizontal Canal Right Symptoms Normal      Horizontal Canal Left   Horizontal Canal Left Duration none    Horizontal Canal Left Symptoms Normal              Objective measurements completed on examination: See above findings.               PT Education - 07/26/20 1124    Education Details Educated on [employer]) Educated Patient    Methods Explanation    Comprehension Verbalized understanding            PT Short  Term Goals - 07/26/20 1140      PT SHORT TERM GOAL #1   Title Patient will be independent with initial vestibular/balance HEP (ALL STGS due: 08/16/20)    Baseline no HEP established    Time 3    Period --   visits   Status New    Target Date 08/16/20      PT SHORT TERM GOAL #2   Title Patient will unergo further vestibular/balance testing and LTG to be set as appropriate    Baseline TBA    Time 3    Period --   visits   Status New      PT SHORT TERM GOAL #3   Title Patient will undrego further cervical assessment and LTG to be set as appropriate    Baseline TBA    Time 3    Period --   visits   Status New             PT Long Term Goals - 07/26/20 1143      PT LONG TERM GOAL #1   Title Patient will be independent with final vestibular/balance HEP (ALL LTGS Due:    Baseline no HEP established    Time 6    Period --   visits   Status New    Target Date 09/06/20      PT LONG TERM GOAL #2   Title LTG to be set for Cervical ROM as appropriate    Baseline TBA    Time 6   visits   Status New  PT LONG TERM GOAL #3   Title LTG to be set for FGA as appropriate    Baseline TBA    Time 6    Period --   visits   Status New                  Plan - 07/26/20 1131    Clinical Impression Statement Patient is a 54 y.o. female that was referred for Neuro OPPT for Vertigo/Imbalance. Patient PMH is significant for the following: Anxiety, GERD, Hyperthyroidism, Vertigo. Upon evaluation, patient has had mild motion sensitivities with positional testing however no nystagmus noted. Upon evaluation patient presents with the following impairments: Pain, Increased muscle spasms/tension, Abormal Posture, Decreased Balance, Dizziness, and increased fall risk. Patient will benefit from skilled PT services to address impairments noted above and improve QoL.    Personal Factors and Comorbidities Time since onset of injury/illness/exacerbation;Comorbidity 2    Comorbidities Anxiety,  GERD, Hyperthyroidism, Vertigo    Examination-Activity Limitations Locomotion Level;Bed Mobility;Caring for Others    Examination-Participation Restrictions Community Activity;Shop;Driving;Laundry    Stability/Clinical Decision Making Evolving/Moderate complexity    Clinical Decision Making Moderate    Rehab Potential Fair    PT Frequency 1x / week    PT Duration 3 weeks   followed by 1x/week for 3 weeks (upon MCD authorization)   PT Treatment/Interventions ADLs/Self Care Home Management;Cryotherapy;Functional mobility training;Therapeutic activities;Therapeutic exercise;Patient/family education;Manual techniques;Passive range of motion;Taping;Dry needling;Canalith Repostioning;Moist Heat;Electrical Stimulation;Gait training;Balance training;Stair training;Neuromuscular re-education;Vestibular    PT Next Visit Plan Continue vestibular/balance assessment (FGA/M-CTSIB), assess Neck ROM. Update LTGs. Initiate HEP focused on vestibular/balance/exe    Consulted and Agree with Plan of Care Patient           Patient will benefit from skilled therapeutic intervention in order to improve the following deficits and impairments:  Decreased balance, Difficulty walking, Increased muscle spasms, Pain, Postural dysfunction, Decreased strength, Decreased range of motion, Dizziness  Visit Diagnosis: Abnormal posture  Dizziness and giddiness  Unsteadiness on feet  Cervicalgia     Problem List Patient Active Problem List   Diagnosis Date Noted  . Hyperthyroidism 10/23/2018  . Special screening for malignant neoplasms, colon 02/11/2018  . History of Helicobacter pylori infection 02/11/2018  . Palpitations 01/22/2018  . Unsteadiness 12/30/2017  . Gastroesophageal reflux disease without esophagitis 09/27/2015  . Varicose veins of leg with pain 04/01/2014    Tempie Donning, PT, DPT 07/26/2020, 11:53 AM  [facility] [address] [place], [place], [address] Phone: [phone]   Fax:  [phone]  Name: Greg Eckrich MRN: [id number] Date of Birth: 22-Oct-1965   Managed medicaid CPT codes: 97110- Therapeutic Exercise, [phone]- Neuro Re-education, [phone] - Gait Training, [phone] - Manual Therapy, [phone] - Therapeutic Activities, [phone] - Self Care, [phone] - Electrical stimulation (Manual) and [id number] - Canalith Repositioning

## 2020-08-02 ENCOUNTER — Ambulatory Visit: Payer: Medicaid Other | Admitting: Physical Therapy

## 2020-08-02 DIAGNOSIS — M2550 Pain in unspecified joint: Secondary | ICD-10-CM | POA: Diagnosis not present

## 2020-08-02 DIAGNOSIS — M542 Cervicalgia: Secondary | ICD-10-CM | POA: Diagnosis not present

## 2020-08-09 ENCOUNTER — Ambulatory Visit: Payer: Medicaid Other

## 2020-09-17 NOTE — Progress Notes (Signed)
Office Visit Note  Patient: Melinda Charles             Date of Birth: 1966-06-19           MRN: [id number]             PCP: Bartholome Bill, MD Referring: Bartholome Bill, MD Visit Date: 09/18/2020   Subjective:  New Patient (Initial Visit) (Patient complains of neck and upper back pain and stiffness. )   History of Present Illness: Melinda Charles is a 54 y.o. female with a history of hyperthyroidism, anxiety, palpitations here for evaluation of arthralgias especially cervicalgia and upper back muscle pain. These symptoms are ongoing for the past year without any specific infection, medication change, or injuries prior to onset. She has noticed multiple knots or lumps over her back that are tender and have improved sometimes with stretching but often case more pain worse than it started when she tries to exercise. She also has persistent tenderness on the neck. She does not notice much problem in the lower extremities.  She also sometimes experiences dizziness sensation and was evaluated by a specialist for vertigo with no problems found in the ear and vestibular system. She denies significant sleep disorder or bad fatigue except when having pain. She has chronic constipation without any history of complications. She does not have problems with headaches.  She also reports right wrist swelling and pain since a few months ago she thinks it was injured in some fashion but not a typical forward fall. It partially improved but has remained swollen and painful with movement for months.   Labs reviewed 07/2020 ESR 12 CRP <5 RA neg  Activities of Daily Living:  Patient reports morning stiffness for 5-10 minutes.   Patient Denies nocturnal pain.  Difficulty dressing/grooming: Denies Difficulty climbing stairs: Denies Difficulty getting out of chair: Denies Difficulty using hands for taps, buttons, cutlery, and/or writing: Denies  Review of Systems  Constitutional:  Positive for fatigue.  HENT: Negative for mouth sores, mouth dryness and nose dryness.   Eyes: Positive for visual disturbance. Negative for pain, itching and dryness.  Respiratory: Negative for cough, hemoptysis, shortness of breath and difficulty breathing.   Cardiovascular: Negative for chest pain, palpitations and swelling in legs/feet.  Gastrointestinal: Negative for abdominal pain, blood in stool, constipation and diarrhea.  Endocrine: Negative for increased urination.  Genitourinary: Negative for painful urination.  Musculoskeletal: Positive for arthralgias, joint pain, myalgias, muscle weakness, morning stiffness, muscle tenderness and myalgias. Negative for joint swelling.  Skin: Negative for color change, rash and redness.  Allergic/Immunologic: Negative for susceptible to infections.  Neurological: Positive for headaches. Negative for dizziness, numbness, memory loss and weakness.  Hematological: Negative for swollen glands.  Psychiatric/Behavioral: Negative for confusion and sleep disturbance.    PMFS History:  Patient Active Problem List   Diagnosis Date Noted  . Pain and swelling of right wrist 09/18/2020  . Upper back pain 09/18/2020  . Neck pain 07/18/2020  . Hyperthyroidism 10/23/2018  . Special screening for malignant neoplasms, colon 02/11/2018  . History of Helicobacter pylori infection 02/11/2018  . Palpitations 01/22/2018  . Unsteadiness 12/30/2017  . Gastroesophageal reflux disease without esophagitis 09/27/2015  . Corn of foot 04/22/2014  . Varicose veins of leg with pain 04/01/2014    Past Medical History:  Diagnosis Date  . Anxiety    Phreesia 06/13/2020  . Gastroesophageal reflux disease without esophagitis 09/27/2015   Last Assessment & Plan:  2 month history of symptoms Supplies  sent to obtain stool sample for H pylori test Trial H2blocker or PPI/reflux precautions Last Assessment & Plan:  2 month history of symptoms Supplies sent to obtain stool sample  for H pylori test Trial H2blocker or PPI/reflux precautions  . History of Helicobacter pylori infection   . Hyperthyroidism   . Varicose veins of leg with pain 04/01/2014   Last Assessment & Plan:  Generalized achiness related to bilateral varicose veins.  Discussed support stockings, extremity elevation, both of which she has done in the past.  She is interested in procedures to alleviate the pain. Last Assessment & Plan:  Generalized achiness related to bilateral varicose veins.  Discussed support stockings, extremity elevation, both of which she has done in the pa    Family History  Problem Relation Age of Onset  . Hypertension Mother   . Emphysema Father   . Hypertension Father   . Hypertension Maternal Grandmother   . Diabetes Maternal Grandfather   . Hypertension Paternal Grandmother   . Heart disease Paternal Grandfather   . GER disease Maternal Uncle    History reviewed. No pertinent surgical history. Social History   Social History Narrative  . Not on file    There is no immunization history on file for this patient.   Objective: Vital Signs: BP 122/76 (BP Location: Right Arm, Patient Position: Sitting, Cuff Size: Normal)   Pulse 67   Ht 5' 3.75" (1.619 m)   Wt 167 lb 9.6 oz (76 kg)   BMI 28.99 kg/m    Physical Exam HENT:     Head:     Comments: Scaly scalp rash on sides and back    Right Ear: External ear normal.     Left Ear: External ear normal.     Mouth/Throat:     Mouth: Mucous membranes are moist.     Pharynx: Oropharynx is clear.  Eyes:     Conjunctiva/sclera: Conjunctivae normal.  Neck:     Comments: Multiple <1cm cervical lymph nodes mildly tender mobile more in posterior Cardiovascular:     Rate and Rhythm: Normal rate and regular rhythm.  Pulmonary:     Effort: Pulmonary effort is normal.     Breath sounds: Normal breath sounds.  Musculoskeletal:     Cervical back: Normal range of motion. Tenderness present.  Skin:    General: Skin is warm and  dry.     Findings: No rash.     Comments: Varicose veins b/l legs  Neurological:     General: No focal deficit present.     Mental Status: She is alert.  Psychiatric:        Mood and Affect: Mood normal.      Musculoskeletal Exam:  Shoulder, elbow full range of motion no tenderness or swelling Upper back tense muscle knots more on right side above scapula Right wrist swelling and limited flexion ROM tolerated, left wrist normal Bilateral hands no synovitis and normal ROM Mild paraspinal tenderness to palpation over upper back Knees, ankles full range of motion no tenderness or swelling    Investigation: No additional findings.  Imaging: No results found.  Recent Labs: Lab Results  Component Value Date   WBC 6.2 11/24/2019   HGB 12.7 11/24/2019   PLT 267 11/24/2019   NA 141 12/28/2019   K 4.3 12/28/2019   CL 102 12/28/2019   CO2 25 12/28/2019   GLUCOSE 108 (H) 11/24/2019   BUN 13 11/24/2019   CREATININE 0.63 11/24/2019   BILITOT 0.2 11/24/2019  ALKPHOS 72 11/24/2019   AST 18 11/24/2019   ALT 10 11/24/2019   PROT 7.2 11/24/2019   ALBUMIN 4.7 11/24/2019   CALCIUM 9.5 11/24/2019   GFRAA 119 11/24/2019    Speciality Comments: No specialty comments available.  Procedures:  No procedures performed Allergies: Patient has no known allergies.   Assessment / Plan:     Visit Diagnoses: Upper back pain Unsteadiness  Upper back and neck pain with numerous tender points and palpable muscle knots with no significantly reduced shoulder or neck range of motion seems consistent with myofascial pain.  She also has some features suggestive of fibromyalgia syndrome including fatigue, exertional intolerance, and dizziness without identifying structural cause can also be seen in this.  No particular evidence of systemic connective tissue disease as underlying cause of symptoms.  Recommended she review printed and online fibromyalgia self-care resources for exercise and activity  pacing recommendations.  She is concerned about balance or alertness effects of medications so might not be a good candidate for SNRIs or other treatments at this time although would be an option.  Rash  Scaly rash on the sides and back of the scalp possibly very dry skin versus psoriasis versus fungal cause.  Recommended initially treating conservatively with over-the-counter treatment.  There is no significant erythema seen.  Loca inflammation from a skin rash can explain the posterior cervical lymphadenopathy.  Pain and swelling of right wrist  Right wrist is swollen and does not tolerate full flexion range of motion she states problem has been persistent for months.  Raises question for undiagnosed fracture or ligamentous injury.  Recommended she continue RICE and conservative treatment but if no more progress in 4 to 6 weeks recommend imaging.  Orders: No orders of the defined types were placed in this encounter.  No orders of the defined types were placed in this encounter.    Follow-Up Instructions: Return in about 6 weeks (around 10/30/2020).   Collier Salina, MD  Note - This record has been created using [employer].  Chart creation errors have been sought, but may not always  have been located. Such creation errors do not reflect on  the standard of medical care.

## 2020-09-18 ENCOUNTER — Other Ambulatory Visit: Payer: Self-pay

## 2020-09-18 ENCOUNTER — Encounter: Payer: Self-pay | Admitting: Internal Medicine

## 2020-09-18 ENCOUNTER — Ambulatory Visit: Payer: Medicaid Other | Admitting: Internal Medicine

## 2020-09-18 VITALS — BP 122/76 | HR 67 | Ht 63.75 in | Wt 167.6 lb

## 2020-09-18 DIAGNOSIS — R2681 Unsteadiness on feet: Secondary | ICD-10-CM

## 2020-09-18 DIAGNOSIS — R21 Rash and other nonspecific skin eruption: Secondary | ICD-10-CM

## 2020-09-18 DIAGNOSIS — M549 Dorsalgia, unspecified: Secondary | ICD-10-CM | POA: Diagnosis not present

## 2020-09-18 DIAGNOSIS — M25431 Effusion, right wrist: Secondary | ICD-10-CM | POA: Insufficient documentation

## 2020-09-18 DIAGNOSIS — M25531 Pain in right wrist: Secondary | ICD-10-CM

## 2020-09-18 NOTE — Patient Instructions (Signed)
I do not see evidence of an inflammatory disease as cause of current symptoms. Your symptoms are suggestive for myofascial pain or fibromyalgia syndrome.  Your scalp rash could be causing the lymph node swelling in your neck. This could be just due to very dry skin and would benefit with moisturizing treatment, but also might be a condition like psoriasis.  Your right wrist is swollen with limited movement, if this continues without improvement for more weeks I recommend we take a closer look and rule out a more serious injury.   I recommend checking out the [facility] [place] patient-centered guide for fibromyalgia and chronic pain management: https://howell-gardner.net/   Myofascial Pain Syndrome and Fibromyalgia Myofascial pain syndrome and fibromyalgia are both pain disorders. This pain may be felt mainly in your muscles.  Myofascial pain syndrome: ? Always has tender points in the muscle that will cause pain when pressed (trigger points). The pain may come and go. ? Usually affects your neck, upper back, and shoulder areas. The pain often radiates into your arms and hands.  Fibromyalgia: ? Has muscle pains and tenderness that come and go. ? Is often associated with fatigue and sleep problems. ? Has trigger points. ? Tends to be long-lasting (chronic), but is not life-threatening. Fibromyalgia and myofascial pain syndrome are not the same. However, they often occur together. If you have both conditions, each can make the other worse. Both are common and can cause enough pain and fatigue to make day-to-day activities difficult. Both can be hard to diagnose because their symptoms are common in many other conditions. What are the causes? The exact causes of these conditions are not known. What increases the risk? You are more likely to develop this condition if:  You have a family history of the condition.  You have certain triggers, such as: ? Spine disorders. ? An injury (trauma)  or other physical stressors. ? Being under a lot of stress. ? Medical conditions such as osteoarthritis, rheumatoid arthritis, or lupus. What are the signs or symptoms? Fibromyalgia The main symptom of fibromyalgia is widespread pain and tenderness in your muscles. Pain is sometimes described as stabbing, shooting, or burning. You may also have:  Tingling or numbness.  Sleep problems and fatigue.  Problems with attention and concentration (fibro fog). Other symptoms may include:  Bowel and bladder problems.  Headaches.  Visual problems.  Problems with odors and noises.  Depression or mood changes.  Painful menstrual periods (dysmenorrhea).  Dry skin or eyes. These symptoms can vary over time. Myofascial pain syndrome Symptoms of myofascial pain syndrome include:  Tight, ropy bands of muscle.  Uncomfortable sensations in muscle areas. These may include aching, cramping, burning, numbness, tingling, and weakness.  Difficulty moving certain parts of the body freely (poor range of motion). How is this diagnosed? This condition may be diagnosed by your symptoms and medical history. You will also have a physical exam. In general:  Fibromyalgia is diagnosed if you have pain, fatigue, and other symptoms for more than 3 months, and symptoms cannot be explained by another condition.  Myofascial pain syndrome is diagnosed if you have trigger points in your muscles, and those trigger points are tender and cause pain elsewhere in your body (referred pain). How is this treated? Treatment for these conditions depends on the type that you have.  For fibromyalgia: ? Pain medicines, such as NSAIDs. ? Medicines for treating depression. ? Medicines for treating seizures. ? Medicines that relax the muscles.  For myofascial pain: ?  Pain medicines, such as NSAIDs. ? Cooling and stretching of muscles. ? Trigger point injections. ? Sound wave (ultrasound) treatments to stimulate  muscles. Treating these conditions often requires a team of health care providers. These may include:  Your primary care provider.  Physical therapist.  Complementary health care providers, such as massage therapists or acupuncturists.  Psychiatrist for cognitive behavioral therapy.   Follow these instructions at home: Medicines  Take over-the-counter and prescription medicines only as told by your health care provider.  Do not drive or use heavy machinery while taking prescription pain medicine.  If you are taking prescription pain medicine, take actions to prevent or treat constipation. Your health care provider may recommend that you: ? Drink enough fluid to keep your urine pale yellow. ? Eat foods that are high in fiber, such as fresh fruits and vegetables, whole grains, and beans. ? Limit foods that are high in fat and processed sugars, such as fried or sweet foods. ? Take an over-the-counter or prescription medicine for constipation. Lifestyle  Exercise as directed by your health care provider or physical therapist.  Practice relaxation techniques to control your stress. You may want to try: ? Biofeedback. ? Visual imagery. ? Hypnosis. ? Muscle relaxation. ? Yoga. ? Meditation.  Maintain a healthy lifestyle. This includes eating a healthy diet and getting enough sleep.  Do not use any products that contain nicotine or tobacco, such as cigarettes and e-cigarettes. If you need help quitting, ask your health care provider.   General instructions  Talk to your health care provider about complementary treatments, such as acupuncture or massage.  Consider joining a support group with others who are diagnosed with this condition.  Do not do activities that stress or strain your muscles. This includes repetitive motions and heavy lifting.  Keep all follow-up visits as told by your health care provider. This is important. Where to find more information  National  Fibromyalgia Association: www.fmaware.org  Arthritis Foundation: www.arthritis.org  American Chronic Pain Association: www.theacpa.org Contact a health care provider if:  You have new symptoms.  Your symptoms get worse or your pain is severe.  You have side effects from your medicines.  You have trouble sleeping.  Your condition is causing depression or anxiety. Summary  Myofascial pain syndrome and fibromyalgia are pain disorders.  Myofascial pain syndrome has tender points in the muscle that will cause pain when pressed (trigger points). Fibromyalgia also has muscle pains and tenderness that come and go, but this condition is often associated with fatigue and sleep disturbances.  Fibromyalgia and myofascial pain syndrome are not the same but often occur together, causing pain and fatigue that make day-to-day activities difficult.  Treatment for fibromyalgia includes taking medicines to relax the muscles and medicines for pain, depression, or seizures. Treatment for myofascial pain syndrome includes taking medicines for pain, cooling and stretching of muscles, and injecting medicines into trigger points.  Follow your health care provider's instructions for taking medicines and maintaining a healthy lifestyle. This information is not intended to replace advice given to you by your health care provider. Make sure you discuss any questions you have with your health care provider. Document Revised: 12/04/2018 Document Reviewed: 08/27/2017 Elsevier Patient Education  2021 [employer].

## 2020-09-19 ENCOUNTER — Telehealth: Payer: Self-pay | Admitting: *Deleted

## 2020-09-19 NOTE — Telephone Encounter (Signed)
Pt received pfizer vaccine 1st dose on 11-25-2019, 2nd dose on 12-20-2019 at [facility] in [place] and [place] booster on 07-25-2020 at [facility] on [address] in [place]

## 2020-10-18 DIAGNOSIS — M50822 Other cervical disc disorders at C5-C6 level: Secondary | ICD-10-CM | POA: Diagnosis not present

## 2020-10-18 DIAGNOSIS — M5020 Other cervical disc displacement, unspecified cervical region: Secondary | ICD-10-CM | POA: Diagnosis not present

## 2020-10-24 DIAGNOSIS — M542 Cervicalgia: Secondary | ICD-10-CM | POA: Diagnosis not present

## 2020-10-27 DIAGNOSIS — M545 Low back pain, unspecified: Secondary | ICD-10-CM | POA: Diagnosis not present

## 2020-10-27 DIAGNOSIS — M542 Cervicalgia: Secondary | ICD-10-CM | POA: Diagnosis not present

## 2020-10-29 NOTE — Progress Notes (Deleted)
Office Visit Note  Patient: Melinda Charles             Date of Birth: 11/29/1965           MRN: [id number]             PCP: Bartholome Bill, MD Referring: Bartholome Bill, MD Visit Date: 10/30/2020   Subjective:  No chief complaint on file.   History of Present Illness: Melinda Charles is a 55 y.o. female here for follow up for joint and muscle pains especially neck and upper back also in the right wrist with swelling.***     No Rheumatology ROS completed.    Previous HPI: Melinda Charles is a 55 y.o. female with a history of hyperthyroidism, anxiety, palpitations here for evaluation of arthralgias especially cervicalgia and upper back muscle pain. These symptoms are ongoing for the past year without any specific infection, medication change, or injuries prior to onset. She has noticed multiple knots or lumps over her back that are tender and have improved sometimes with stretching but often case more pain worse than it started when she tries to exercise. She also has persistent tenderness on the neck. She does not notice much problem in the lower extremities.  She also sometimes experiences dizziness sensation and was evaluated by a specialist for vertigo with no problems found in the ear and vestibular system. She denies significant sleep disorder or bad fatigue except when having pain. She has chronic constipation without any history of complications. She does not have problems with headaches.  She also reports right wrist swelling and pain since a few months ago she thinks it was injured in some fashion but not a typical forward fall. It partially improved but has remained swollen and painful with movement for months.   Labs reviewed 07/2020 ESR 12 CRP <5 RA neg   PMFS History:  Patient Active Problem List   Diagnosis Date Noted  . Pain and swelling of right wrist 09/18/2020  . Upper back pain 09/18/2020  . Neck pain 07/18/2020  .  Hyperthyroidism 10/23/2018  . Special screening for malignant neoplasms, colon 02/11/2018  . History of Helicobacter pylori infection 02/11/2018  . Palpitations 01/22/2018  . Unsteadiness 12/30/2017  . Gastroesophageal reflux disease without esophagitis 09/27/2015  . Corn of foot 04/22/2014  . Varicose veins of leg with pain 04/01/2014    Past Medical History:  Diagnosis Date  . Anxiety    Phreesia 06/13/2020  . Gastroesophageal reflux disease without esophagitis 09/27/2015   Last Assessment & Plan:  2 month history of symptoms Supplies sent to obtain stool sample for H pylori test Trial H2blocker or PPI/reflux precautions Last Assessment & Plan:  2 month history of symptoms Supplies sent to obtain stool sample for H pylori test Trial H2blocker or PPI/reflux precautions  . History of Helicobacter pylori infection   . Hyperthyroidism   . Varicose veins of leg with pain 04/01/2014   Last Assessment & Plan:  Generalized achiness related to bilateral varicose veins.  Discussed support stockings, extremity elevation, both of which she has done in the past.  She is interested in procedures to alleviate the pain. Last Assessment & Plan:  Generalized achiness related to bilateral varicose veins.  Discussed support stockings, extremity elevation, both of which she has done in the pa    Family History  Problem Relation Age of Onset  . Hypertension Mother   . Emphysema Father   . Hypertension Father   . Hypertension Maternal Grandmother   .  Diabetes Maternal Grandfather   . Hypertension Paternal Grandmother   . Heart disease Paternal Grandfather   . GER disease Maternal Uncle    No past surgical history on file. Social History   Social History Narrative  . Not on file    There is no immunization history on file for this patient.   Objective: Vital Signs: There were no vitals taken for this visit.   Physical Exam   Musculoskeletal Exam: ***  CDAI Exam: CDAI Score: - Patient Global:  -; Provider Global: - Swollen: -; Tender: - Joint Exam 10/30/2020   No joint exam has been documented for this visit   There is currently no information documented on the homunculus. Go to the Rheumatology activity and complete the homunculus joint exam.  Investigation: No additional findings.  Imaging: No results found.  Recent Labs: Lab Results  Component Value Date   WBC 6.2 11/24/2019   HGB 12.7 11/24/2019   PLT 267 11/24/2019   NA 141 12/28/2019   K 4.3 12/28/2019   CL 102 12/28/2019   CO2 25 12/28/2019   GLUCOSE 108 (H) 11/24/2019   BUN 13 11/24/2019   CREATININE 0.63 11/24/2019   BILITOT 0.2 11/24/2019   ALKPHOS 72 11/24/2019   AST 18 11/24/2019   ALT 10 11/24/2019   PROT 7.2 11/24/2019   ALBUMIN 4.7 11/24/2019   CALCIUM 9.5 11/24/2019   GFRAA 119 11/24/2019    Speciality Comments: No specialty comments available.  Procedures:  No procedures performed Allergies: Patient has no known allergies.   Assessment / Plan:     Visit Diagnoses: No diagnosis found.  ***  Orders: No orders of the defined types were placed in this encounter.  No orders of the defined types were placed in this encounter.    Follow-Up Instructions: No follow-ups on file.   Collier Salina, MD  Note - This record has been created using [employer].  Chart creation errors have been sought, but may not always  have been located. Such creation errors do not reflect on  the standard of medical care.

## 2020-10-30 ENCOUNTER — Ambulatory Visit: Payer: Medicaid Other | Admitting: Internal Medicine

## 2021-02-13 ENCOUNTER — Ambulatory Visit: Payer: Medicaid Other | Admitting: Internal Medicine

## 2021-05-03 ENCOUNTER — Ambulatory Visit: Payer: Medicaid Other | Admitting: Internal Medicine

## 2021-05-06 NOTE — Progress Notes (Signed)
Office Visit Note  Patient: Melinda Charles             Date of Birth: 04/30/1966           MRN: [id number]             PCP: Bartholome Bill, MD Referring: Bartholome Bill, MD Visit Date: 05/07/2021   Subjective:  Follow-up (Patient complains of continued neck pain.)   History of Present Illness: Melinda Charles is a 55 y.o. female here for follow up with some ongoing problems with pain mostly in the neck and upper back.  She continues feeling sometimes lumps or nodules in the affected areas.  She also has headaches and fatigue these are doing somewhat worse as well as the pain and stiffness.  Her symptoms are very distracting sometimes disruptive for sleep and rest due to pain or discomfort.  She had MRI of the cervical spine checked with some mild abnormalities recommendation for orthopedic surgery follow-up to evaluate this.  Previous HPI 09/18/20 Melinda Charles is a 55 y.o. female with a history of hyperthyroidism, anxiety, palpitations here for evaluation of arthralgias especially cervicalgia and upper back muscle pain. These symptoms are ongoing for the past year without any specific infection, medication change, or injuries prior to onset. She has noticed multiple knots or lumps over her back that are tender and have improved sometimes with stretching but often case more pain worse than it started when she tries to exercise. She also has persistent tenderness on the neck. She does not notice much problem in the lower extremities.   She also sometimes experiences dizziness sensation and was evaluated by a specialist for vertigo with no problems found in the ear and vestibular system. She denies significant sleep disorder or bad fatigue except when having pain. She has chronic constipation without any history of complications. She does not have problems with headaches.   She also reports right wrist swelling and pain since a few months ago she thinks it was  injured in some fashion but not a typical forward fall. It partially improved but has remained swollen and painful with movement for months.     Labs reviewed 07/2020 ESR 12 CRP <5 RA neg   Review of Systems  Constitutional:  Positive for fatigue.  HENT:  Negative for mouth sores, mouth dryness and nose dryness.   Eyes:  Positive for pain. Negative for itching, visual disturbance and dryness.  Respiratory:  Negative for cough, hemoptysis, shortness of breath and difficulty breathing.   Cardiovascular:  Positive for palpitations. Negative for chest pain and swelling in legs/feet.  Gastrointestinal:  Negative for abdominal pain, blood in stool, constipation and diarrhea.  Endocrine: Negative for increased urination.  Genitourinary:  Negative for painful urination.  Musculoskeletal:  Positive for joint pain, joint pain, joint swelling, myalgias, muscle weakness, morning stiffness, muscle tenderness and myalgias.  Skin:  Negative for color change, rash and redness.  Allergic/Immunologic: Negative for susceptible to infections.  Neurological:  Positive for numbness and headaches. Negative for dizziness, memory loss and weakness.  Hematological:  Negative for swollen glands.  Psychiatric/Behavioral:  Negative for confusion and sleep disturbance.    PMFS History:  Patient Active Problem List   Diagnosis Date Noted   Pain and swelling of right wrist 09/18/2020   Upper back pain 09/18/2020   Neck pain 07/18/2020   Hyperthyroidism 10/23/2018   Special screening for malignant neoplasms, colon 39/10/90   History of Helicobacter pylori infection 02/11/2018   Palpitations 01/22/2018  Unsteadiness 12/30/2017   Gastroesophageal reflux disease without esophagitis 09/27/2015   Corn of foot 04/22/2014   Varicose veins of leg with pain 04/01/2014    Past Medical History:  Diagnosis Date   Anxiety    Phreesia 06/13/2020   Gastroesophageal reflux disease without esophagitis 09/27/2015   Last  Assessment & Plan:  2 month history of symptoms Supplies sent to obtain stool sample for H pylori test Trial H2blocker or PPI/reflux precautions Last Assessment & Plan:  2 month history of symptoms Supplies sent to obtain stool sample for H pylori test Trial H2blocker or PPI/reflux precautions   History of Helicobacter pylori infection    Hyperthyroidism    Varicose veins of leg with pain 04/01/2014   Last Assessment & Plan:  Generalized achiness related to bilateral varicose veins.  Discussed support stockings, extremity elevation, both of which she has done in the past.  She is interested in procedures to alleviate the pain. Last Assessment & Plan:  Generalized achiness related to bilateral varicose veins.  Discussed support stockings, extremity elevation, both of which she has done in the pa    Family History  Problem Relation Age of Onset   Hypertension Mother    Emphysema Father    Hypertension Father    Hypertension Maternal Grandmother    Diabetes Maternal Grandfather    Hypertension Paternal Grandmother    Heart disease Paternal Grandfather    GER disease Maternal Uncle    History reviewed. No pertinent surgical history. Social History   Social History Narrative   Not on file    There is no immunization history on file for this patient.   Objective: Vital Signs: BP 125/81 (BP Location: Left Arm, Patient Position: Sitting, Cuff Size: Normal)   Pulse 80   Ht _0  (1.626 m)   Wt 176 lb 3.2 oz (79.9 kg)   BMI 30.24 kg/m    Physical Exam Skin:    General: Skin is warm and dry.  Psychiatric:        Mood and Affect: Mood normal.     Musculoskeletal Exam:  Neck soreness with lateral rotation bilaterally Shoulders full ROM no tenderness or swelling Elbows full ROM no tenderness or swelling Wrists full ROM no tenderness or swelling Fingers full ROM no tenderness or swelling Bilateral paraspinal muscle tenderness to palpation over upper back some palpable myofascial  bundles Knees full ROM no tenderness or swelling  Investigation: No additional findings.  Imaging: No results found.  Recent Labs: Lab Results  Component Value Date   WBC 6.2 11/24/2019   HGB 12.7 11/24/2019   PLT 267 11/24/2019   NA 141 12/28/2019   K 4.3 12/28/2019   CL 102 12/28/2019   CO2 25 12/28/2019   GLUCOSE 108 (H) 11/24/2019   BUN 13 11/24/2019   CREATININE 0.63 11/24/2019   BILITOT 0.2 11/24/2019   ALKPHOS 72 11/24/2019   AST 18 11/24/2019   ALT 10 11/24/2019   PROT 7.2 11/24/2019   ALBUMIN 4.7 11/24/2019   CALCIUM 9.5 11/24/2019   GFRAA 119 11/24/2019    Speciality Comments: No specialty comments available.  Procedures:  No procedures performed Allergies: Patient has no known allergies.   Assessment / Plan:     Visit Diagnoses: Upper back pain - Plan: cyclobenzaprine (FLEXERIL) 10 MG tablet  Pain symptoms look primarily like myofascial pain or possibly some strain source.  Could have impingement related to muscles but MRI was nonspecific.   We will hold off and patient not extremely  interested in trigger point treatments.  Recommend trying Flexeril 10 mg at night as needed for symptoms to see if this helps and also prevent the sleep disruptions.  I see no specific changes concerning for an inflammatory problem at this time. She does plan for follow-up with a specialist for this may be able to provide more extensive directed treatments.  Pain and swelling of right wrist  Localized swelling with decreased range of motion has improved compared to our exam earlier this year.  Suspect this was a soft tissue injury sustained do not see evidence of ongoing inflammation at this time.   Orders: No orders of the defined types were placed in this encounter.  Meds ordered this encounter  Medications   cyclobenzaprine (FLEXERIL) 10 MG tablet    Sig: Take 1 tablet (10 mg total) by mouth at bedtime as needed for muscle spasms.    Dispense:  30 tablet    Refill:  0       Follow-Up Instructions: Return if symptoms worsen or fail to improve.   Collier Salina, MD  Note - This record has been created using [employer].  Chart creation errors have been sought, but may not always  have been located. Such creation errors do not reflect on  the standard of medical care.

## 2021-05-07 ENCOUNTER — Other Ambulatory Visit: Payer: Self-pay

## 2021-05-07 ENCOUNTER — Ambulatory Visit: Payer: Medicaid Other | Admitting: Internal Medicine

## 2021-05-07 ENCOUNTER — Encounter: Payer: Self-pay | Admitting: Internal Medicine

## 2021-05-07 VITALS — BP 125/81 | HR 80 | Ht 64.0 in | Wt 176.2 lb

## 2021-05-07 DIAGNOSIS — M25431 Effusion, right wrist: Secondary | ICD-10-CM

## 2021-05-07 DIAGNOSIS — M25531 Pain in right wrist: Secondary | ICD-10-CM

## 2021-05-07 DIAGNOSIS — R2681 Unsteadiness on feet: Secondary | ICD-10-CM | POA: Diagnosis not present

## 2021-05-07 DIAGNOSIS — M549 Dorsalgia, unspecified: Secondary | ICD-10-CM

## 2021-05-07 MED ORDER — CYCLOBENZAPRINE HCL 10 MG PO TABS: 10.0000 mg | ORAL_TABLET | Freq: Every evening | ORAL | 0 refills | Status: DC | PRN

## 2021-05-07 NOTE — Patient Instructions (Signed)
Cyclobenzaprine Tablets What is this medication? CYCLOBENZAPRINE (sye kloe BEN za preen) treats muscle spasms. It works by relaxing your muscles, which reduces muscle stiffness. It belongs to a group of medications called muscle relaxants. This medicine may be used for other purposes; ask your health care provider or pharmacist if you have questions. COMMON BRAND NAME(S): Fexmid, Flexeril What should I tell my care team before I take this medication? They need to know if you have any of these conditions: Heart disease, irregular heartbeat, or previous heart attack Liver disease Thyroid problem An unusual or allergic reaction to cyclobenzaprine, tricyclic antidepressants, lactose, other medications, foods, dyes, or preservatives Pregnant or trying to get pregnant Breast-feeding How should I use this medication? Take this medication by mouth with a glass of water. Follow the directions on the prescription label. If this medication upsets your stomach, take it with food or milk. Take your medication at regular intervals. Do not take it more often than directed. Talk to your care team about the use of this medication in children. Special care may be needed. Overdosage: If you think you have taken too much of this medicine contact a poison control center or emergency room at once. NOTE: This medicine is only for you. Do not share this medicine with others. What if I miss a dose? If you miss a dose, take it as soon as you can. If it is almost time for your next dose, take only that dose. Do not take double or extra doses. What may interact with this medication? Do not take this medication with any of the following: MAOIs like Carbex, Eldepryl, Marplan, Nardil, and Parnate Narcotic medications for cough Safinamide This medication may also interact with the following: Alcohol Bupropion Antihistamines for allergy, cough and cold Certain medications for anxiety or sleep Certain medications for  bladder problems like oxybutynin, tolterodine Certain medications for depression like amitriptyline, fluoxetine, sertraline Certain medications for Parkinson's disease like benztropine, trihexyphenidyl Certain medications for seizures like phenobarbital, primidone Certain medications for stomach problems like dicyclomine, hyoscyamine Certain medications for travel sickness like scopolamine General anesthetics like halothane, isoflurane, methoxyflurane, propofol Ipratropium Local anesthetics like lidocaine, pramoxine, tetracaine Medications that relax muscles for surgery Narcotic medications for pain Phenothiazines like chlorpromazine, mesoridazine, prochlorperazine, thioridazine Verapamil This list may not describe all possible interactions. Give your health care provider a list of all the medicines, herbs, non-prescription drugs, or dietary supplements you use. Also tell them if you smoke, drink alcohol, or use illegal drugs. Some items may interact with your medicine. What should I watch for while using this medication? Tell your care team if your symptoms do not start to get better or if they get worse. You may get drowsy or dizzy. Do not drive, use machinery, or do anything that needs mental alertness until you know how this medication affects you. Do not stand or sit up quickly, especially if you are an older patient. This reduces the risk of dizzy or fainting spells. Alcohol may interfere with the effect of this medication. Avoid alcoholic drinks. If you are taking another medication that also causes drowsiness, you may have more side effects. Give your care team a list of all medications you use. Your care team will tell you how much medication to take. Do not take more medication than directed. Call emergency for help if you have problems breathing or unusual sleepiness. Your mouth may get dry. Chewing sugarless gum or sucking hard candy, and drinking plenty of water may help. Contact your    care team if the problem does not go away or is severe. What side effects may I notice from receiving this medication? Side effects that you should report to your care team as soon as possible: Allergic reactions-skin rash, itching, hives, swelling of the face, lips, tongue, or throat CNS depression-slow or shallow breathing, shortness of breath, feeling faint, dizziness, confusion, trouble staying awake Heart rhythm changes-fast or irregular heartbeat, dizziness, feeling faint or lightheaded, chest pain, trouble breathing Side effects that usually do not require medical attention (report to your care team if they continue or are bothersome): Constipation Dizziness Drowsiness Dry mouth Fatigue Nausea This list may not describe all possible side effects. Call your doctor for medical advice about side effects. You may report side effects to FDA at 1-800-FDA-1088. Where should I keep my medication? Keep out of the reach of children. Store at room temperature between 15 and 30 degrees C (59 and 86 degrees F). Keep container tightly closed. Throw away any unused medication after the expiration date. NOTE: This sheet is a summary. It may not cover all possible information. If you have questions about this medicine, talk to your doctor, pharmacist, or health care provider.  2022 Elsevier/Gold Standard (2020-11-23 09:41:19)  

## 2021-06-16 DIAGNOSIS — Z20822 Contact with and (suspected) exposure to covid-19: Secondary | ICD-10-CM | POA: Diagnosis not present

## 2021-06-24 DIAGNOSIS — Z20822 Contact with and (suspected) exposure to covid-19: Secondary | ICD-10-CM | POA: Diagnosis not present

## 2021-06-29 ENCOUNTER — Encounter: Payer: Self-pay | Admitting: Internal Medicine

## 2021-06-29 ENCOUNTER — Other Ambulatory Visit: Payer: Self-pay

## 2021-06-29 ENCOUNTER — Ambulatory Visit (INDEPENDENT_AMBULATORY_CARE_PROVIDER_SITE_OTHER): Payer: Medicaid Other | Admitting: Internal Medicine

## 2021-06-29 VITALS — BP 120/74 | HR 99 | Ht 64.0 in | Wt 168.0 lb

## 2021-06-29 DIAGNOSIS — E0500 Thyrotoxicosis with diffuse goiter without thyrotoxic crisis or storm: Secondary | ICD-10-CM | POA: Insufficient documentation

## 2021-06-29 LAB — T4, FREE: Free T4: 0.98 ng/dL (ref 0.60–1.60)

## 2021-06-29 LAB — TSH: TSH: 2.82 u[IU]/mL (ref 0.35–5.50)

## 2021-06-29 NOTE — Progress Notes (Signed)
Name: Melinda Charles  MRN/ DOB: [id number], [birth year]    Age/ Sex: 55 y.o., female    PCP: Verlon Au, MD   Reason for Endocrinology Evaluation: Hyperthyroidism     Date of Initial Endocrinology Evaluation: 06/29/2021     HPI: [name] is a 55 y.o. female with a past medical history of GERD and Hyperthyroidism. The patient presented for initial endocrinology clinic visit on 06/29/2021 for consultative assistance with her Hyperthyroidism.   Transferred care from Dr. Marlowe Kays with [facility]   She was diagnosed with hyperthyroidism in 09/2018 . This has  been attributed to Graves' disease with a TSI elevated at 4 . This was diagnosed during evaluation  for Vertigo , she was also having panic attacks at the time.     She has been on Methimazole from 2020 , she self discontinued a few months later     Denies weight loss  Denies loose stools or diarrhea  Occasional  palpitations associated with neck spasms  Denies hand tremors  Panic attacks slightly better   She has has been issues with neck pain and back pain, sees rheumatology    No Fh of thyroid disease       HISTORY:  Past Medical History:  Past Medical History:  Diagnosis Date   Anxiety    Phreesia 06/13/2020   Gastroesophageal reflux disease without esophagitis 09/27/2015   Last Assessment & Plan:  2 month history of symptoms Supplies sent to obtain stool sample for H pylori test Trial H2blocker or PPI/reflux precautions Last Assessment & Plan:  2 month history of symptoms Supplies sent to obtain stool sample for H pylori test Trial H2blocker or PPI/reflux precautions   History of Helicobacter pylori infection    Hyperthyroidism    Varicose veins of leg with pain 04/01/2014   Last Assessment & Plan:  Generalized achiness related to bilateral varicose veins.  Discussed support stockings, extremity elevation, both of which she has done in the past.  She is interested in procedures to  alleviate the pain. Last Assessment & Plan:  Generalized achiness related to bilateral varicose veins.  Discussed support stockings, extremity elevation, both of which she has done in the pa   Past Surgical History: No past surgical history on file.  Social History:  reports that she has never smoked. She has never used smokeless tobacco. She reports that she does not drink alcohol and does not use drugs. Family History: family history includes Diabetes in her maternal grandfather; Emphysema in her father; GER disease in her maternal uncle; Heart disease in her paternal grandfather; Hypertension in her father, maternal grandmother, mother, and paternal grandmother.   HOME MEDICATIONS: Allergies as of 06/29/2021   No Known Allergies      Medication List        Accurate as of June 29, 2021  9:19 AM. If you have any questions, ask your nurse or doctor.          STOP taking these medications    amoxicillin 500 MG capsule Commonly known as: AMOXIL Stopped by: Scarlette Shorts, MD   meclizine 50 MG tablet Commonly known as: ANTIVERT Stopped by: Scarlette Shorts, MD       TAKE these medications    Acetaminophen-Codeine 300-30 MG tablet Take 1 tablet by mouth as needed.   cyclobenzaprine 10 MG tablet Commonly known as: FLEXERIL Take 1 tablet (10 mg total) by mouth at bedtime as needed for muscle spasms.   ibuprofen 800  MG tablet Commonly known as: ADVIL as needed.   multivitamin tablet Take 1 tablet by mouth daily.   VITAMIN B COMPLEX PO Take by mouth.   VITAMIN C PO Take by mouth.   VITAMIN D PO Take by mouth.          REVIEW OF SYSTEMS: A comprehensive ROS was conducted with the patient and is negative except as per HPI and below:  ROS     OBJECTIVE:  VS: BP 120/74 (BP Location: Left Arm, Patient Position: Sitting, Cuff Size: Small)   Pulse 99   Ht 5\' 4"  (1.626 m)   Wt 168 lb (76.2 kg)   SpO2 (!) 84%   BMI 28.84 kg/m    Wt Readings  from Last 3 Encounters:  06/29/21 168 lb (76.2 kg)  05/07/21 176 lb 3.2 oz (79.9 kg)  09/18/20 167 lb 9.6 oz (76 kg)     EXAM: General: Pt appears well and is in NAD  Eyes: External eye exam normal without stare, lid lag or exophthalmos.  EOM intact. .  Neck: General: Supple without adenopathy. Thyroid: Thyroid size normal.  No goiter or nodules appreciated. No thyroid bruit.  Lungs: Clear with good BS bilat with no rales, rhonchi, or wheezes  Heart: Auscultation: RRR.  Abdomen: Normoactive bowel sounds, soft, nontender, without masses or organomegaly palpable  Extremities:  BL LE: No pretibial edema normal ROM and strength.  Skin: Hair: Texture and amount normal with gender appropriate distribution Skin Inspection: No rashes Skin Palpation: Skin temperature, texture, and thickness normal to palpation  Neuro: Cranial nerves: II - XII grossly intact  Motor: Normal strength throughout DTRs: 2+ and symmetric in UE without delay in relaxation phase  Mental Status: Judgment, insight: Intact Orientation: Oriented to time, place, and person Mood and affect: No depression, anxiety, or agitation     DATA REVIEWED: Results for AYANE, DELANCEY (MRN Danielle Rankin) as of 06/29/2021 16:16  Ref. Range 06/29/2021 09:26  TSH Latest Ref Range: 0.35 - 5.50 uIU/mL 2.82  T4,Free(Direct) Latest Ref Range: 0.60 - 1.60 ng/dL 13/11/2020      ASSESSMENT/PLAN/RECOMMENDATIONS:   1. Graves' Disease:   -Patient is clinically euthyroid -No local neck symptoms -TFTs are normal which indicates patient is in remission -She has been off methimazole for 2 years -Reassurance provided, patient understands that Graves' disease could relapse at any time and there is no prediction of timing, emphasized the importance of managing stress properly  Follow-up in 1 year   Signed electronically by: 5.73, MD  [facility]  [facility] [address] [place], [place] Phone: [phone] FAX: [phone]   CC: [phone], MD [address] [place] Phone: [phone] Fax: [phone]   Return to Endocrinology clinic as below: No future appointments.

## 2021-06-30 LAB — T3: T3, Total: 109 ng/dL (ref 76–181)

## 2021-11-15 ENCOUNTER — Telehealth: Payer: Self-pay

## 2021-11-16 NOTE — Telephone Encounter (Signed)
Note not needed 

## 2021-12-17 NOTE — Progress Notes (Deleted)
Office Visit Note  Patient: Melinda Charles             Date of Birth: 05-09-1966           MRN: [id number]             PCP: Bartholome Bill, MD Referring: Bartholome Bill, MD Visit Date: 12/18/2021   Subjective:  No chief complaint on file.   History of Present Illness: Melinda Charles is a 56 y.o. female here for follow up for pain in [place] and upper back ongoing problem for which we prescribed flexeril to try as needed. Last endocrinology f/u clinically euthyroid and no graves activity. ***   Previous HPI 05/07/21 [name] is a 56 y.o. female here for follow up with some ongoing problems with pain mostly in the neck and upper back.  She continues feeling sometimes lumps or nodules in the affected areas.  She also has headaches and fatigue these are doing somewhat worse as well as the pain and stiffness.  Her symptoms are very distracting sometimes disruptive for sleep and rest due to pain or discomfort.  She had MRI of the cervical spine checked with some mild abnormalities recommendation for orthopedic surgery follow-up to evaluate this.   Previous HPI 09/18/20 [name] is a 56 y.o. female with a history of hyperthyroidism, anxiety, palpitations here for evaluation of arthralgias especially cervicalgia and upper back muscle pain. These symptoms are ongoing for the past year without any specific infection, medication change, or injuries prior to onset. She has noticed multiple knots or lumps over her back that are tender and have improved sometimes with stretching but often case more pain worse than it started when she tries to exercise. She also has persistent tenderness on the neck. She does not notice much problem in the lower extremities.   She also sometimes experiences dizziness sensation and was evaluated by a specialist for vertigo with no problems found in the ear and vestibular system. She denies significant sleep disorder or bad fatigue  except when having pain. She has chronic constipation without any history of complications. She does not have problems with headaches.   She also reports right wrist swelling and pain since a few months ago she thinks it was injured in some fashion but not a typical forward fall. It partially improved but has remained swollen and painful with movement for months.     Labs reviewed 07/2020 ESR 12 CRP <5 RA neg   No Rheumatology ROS completed.   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/22/2014   Varicose veins of leg with pain 04/01/2014    Past Medical History:  Diagnosis Date   Anxiety    Phreesia 06/13/2020   Gastroesophageal reflux disease without esophagitis 09/27/2015   Last Assessment & Plan:  2 month history of symptoms Supplies sent to obtain stool sample for H pylori test Trial H2blocker or PPI/reflux precautions Last Assessment & Plan:  2 month history of symptoms Supplies sent to obtain stool sample for H pylori test Trial H2blocker or PPI/reflux precautions  History of Helicobacter pylori infection    Hyperthyroidism    Varicose veins of leg with pain 04/01/2014   Last Assessment & Plan:  Generalized achiness related to bilateral varicose veins.  Discussed support stockings, extremity elevation, both of which she has done in the past.  She is interested in procedures to alleviate the pain. Last Assessment & Plan:  Generalized achiness related to bilateral varicose veins.  Discussed support stockings, extremity elevation, both of which she has done  in the pa    Family History  Problem Relation Age of Onset   Hypertension Mother    Emphysema Father    Hypertension Father    Hypertension Maternal Grandmother    Diabetes Maternal Grandfather    Hypertension Paternal Grandmother    Heart disease Paternal Grandfather    GER disease Maternal Uncle    No past surgical history on file. Social History   Social History Narrative   Not on file    There is no immunization history on file for this patient.   Objective: Vital Signs: There were no vitals taken for this visit.   Physical Exam   Musculoskeletal Exam: ***  CDAI Exam: CDAI Score: -- Patient Global: --; Provider Global: -- Swollen: --; Tender: -- Joint Exam 12/18/2021   No joint exam has been documented for this visit   There is currently no information documented on the homunculus. Go to the Rheumatology activity and complete the homunculus joint exam.  Investigation: No additional findings.  Imaging: No results found.  Recent Labs: Lab Results  Component Value Date   WBC 6.2 11/24/2019   HGB 12.7 11/24/2019   PLT 267 11/24/2019   NA 141 12/28/2019   K 4.3 12/28/2019   CL 102 12/28/2019   CO2 25 12/28/2019   GLUCOSE 108 (H) 11/24/2019   BUN 13 11/24/2019   CREATININE 0.63 11/24/2019   BILITOT 0.2 11/24/2019   ALKPHOS 72 11/24/2019   AST 18 11/24/2019   ALT 10 11/24/2019   PROT 7.2 11/24/2019   ALBUMIN 4.7 11/24/2019   CALCIUM 9.5 11/24/2019   GFRAA 119 11/24/2019    Speciality Comments: No specialty comments available.  Procedures:  No procedures performed Allergies: Patient has no known allergies.   Assessment / Plan:     Visit Diagnoses: No diagnosis found.  ***  Orders: No orders of the defined types were placed in this encounter.  No orders of the defined types were placed in this encounter.    Follow-Up Instructions: No follow-ups on file.   Collier Salina, MD  Note - This record has been created using [employer].  Chart creation errors have been sought, but may not always  have been located. Such creation errors do not reflect on  the standard of medical care.

## 2021-12-18 ENCOUNTER — Ambulatory Visit: Payer: Medicaid Other | Admitting: Internal Medicine

## 2022-04-01 NOTE — Progress Notes (Deleted)
Office Visit Note  Patient: Melinda Charles             Date of Birth: Sep 15, 1965           MRN: [id number]             PCP: Bartholome Bill, MD Referring: Bartholome Bill, MD Visit Date: 04/12/2022   Subjective:  No chief complaint on file.   History of Present Illness: Melinda Charles is a 56 y.o. female here for follow up follow up with some ongoing problems with pain mostly in the neck and upper back.   Previous HPI 05/07/2021  Melinda Charles is a 57 y.o. female here for follow up with some ongoing problems with pain mostly in the neck and upper back.  She continues feeling sometimes lumps or nodules in the affected areas.  She also has headaches and fatigue these are doing somewhat worse as well as the pain and stiffness.  Her symptoms are very distracting sometimes disruptive for sleep and rest due to pain or discomfort.  She had MRI of the cervical spine checked with some mild abnormalities recommendation for orthopedic surgery follow-up to evaluate this.   Previous HPI 09/18/20 Melinda Charles is a 56 y.o. female with a history of hyperthyroidism, anxiety, palpitations here for evaluation of arthralgias especially cervicalgia and upper back muscle pain. These symptoms are ongoing for the past year without any specific infection, medication change, or injuries prior to onset. She has noticed multiple knots or lumps over her back that are tender and have improved sometimes with stretching but often case more pain worse than it started when she tries to exercise. She also has persistent tenderness on the neck. She does not notice much problem in the lower extremities.   She also sometimes experiences dizziness sensation and was evaluated by a specialist for vertigo with no problems found in the ear and vestibular system. She denies significant sleep disorder or bad fatigue except when having pain. She has chronic constipation without any history of  complications. She does not have problems with headaches.   She also reports right wrist swelling and pain since a few months ago she thinks it was injured in some fashion but not a typical forward fall. It partially improved but has remained swollen and painful with movement for months.     Labs reviewed 07/2020 ESR 12 CRP <5 RA neg   No Rheumatology ROS completed.   PMFS History:  Patient Active Problem List   Diagnosis Date Noted   Graves disease 06/29/2021   Pain and swelling of right wrist 09/18/2020   Upper back pain 09/18/2020   Neck pain 07/18/2020   Hyperthyroidism 10/23/2018   Special screening for malignant neoplasms, colon 08/65/7846   History of Helicobacter pylori infection 02/11/2018   Palpitations 01/22/2018   Unsteadiness 12/30/2017   Gastroesophageal reflux disease without esophagitis 09/27/2015   Corn of foot 04/22/2014   Varicose veins of leg with pain 04/01/2014    Past Medical History:  Diagnosis Date   Anxiety    Phreesia 06/13/2020   Gastroesophageal reflux disease without esophagitis 09/27/2015   Last Assessment & Plan:  2 month history of symptoms Supplies sent to obtain stool sample for H pylori test Trial H2blocker or PPI/reflux precautions Last Assessment & Plan:  2 month history of symptoms Supplies sent to obtain stool sample for H pylori test Trial H2blocker or PPI/reflux precautions   History of Helicobacter pylori infection    Hyperthyroidism  Varicose veins of leg with pain 04/01/2014   Last Assessment & Plan:  Generalized achiness related to bilateral varicose veins.  Discussed support stockings, extremity elevation, both of which she has done in the past.  She is interested in procedures to alleviate the pain. Last Assessment & Plan:  Generalized achiness related to bilateral varicose veins.  Discussed support stockings, extremity elevation, both of which she has done in the pa    Family History  Problem Relation Age of Onset    Hypertension Mother    Emphysema Father    Hypertension Father    Hypertension Maternal Grandmother    Diabetes Maternal Grandfather    Hypertension Paternal Grandmother    Heart disease Paternal Grandfather    GER disease Maternal Uncle    No past surgical history on file. Social History   Social History Narrative   Not on file    There is no immunization history on file for this patient.   Objective: Vital Signs: There were no vitals taken for this visit.   Physical Exam   Musculoskeletal Exam: ***  CDAI Exam: CDAI Score: -- Patient Global: --; Provider Global: -- Swollen: --; Tender: -- Joint Exam 04/12/2022   No joint exam has been documented for this visit   There is currently no information documented on the homunculus. Go to the Rheumatology activity and complete the homunculus joint exam.  Investigation: No additional findings.  Imaging: No results found.  Recent Labs: Lab Results  Component Value Date   WBC 6.2 11/24/2019   HGB 12.7 11/24/2019   PLT 267 11/24/2019   NA 141 12/28/2019   K 4.3 12/28/2019   CL 102 12/28/2019   CO2 25 12/28/2019   GLUCOSE 108 (H) 11/24/2019   BUN 13 11/24/2019   CREATININE 0.63 11/24/2019   BILITOT 0.2 11/24/2019   ALKPHOS 72 11/24/2019   AST 18 11/24/2019   ALT 10 11/24/2019   PROT 7.2 11/24/2019   ALBUMIN 4.7 11/24/2019   CALCIUM 9.5 11/24/2019   GFRAA 119 11/24/2019    Speciality Comments: No specialty comments available.  Procedures:  No procedures performed Allergies: Patient has no known allergies.   Assessment / Plan:     Visit Diagnoses: No diagnosis found.  ***  Orders: No orders of the defined types were placed in this encounter.  No orders of the defined types were placed in this encounter.    Follow-Up Instructions: No follow-ups on file.   Bertram Savin, RT  Note - This record has been created using [employer].  Chart creation errors have been sought, but may not always   have been located. Such creation errors do not reflect on  the standard of medical care.

## 2022-04-12 ENCOUNTER — Ambulatory Visit: Payer: Medicaid Other | Attending: Internal Medicine | Admitting: Internal Medicine

## 2022-04-12 DIAGNOSIS — M25431 Effusion, right wrist: Secondary | ICD-10-CM

## 2022-04-12 DIAGNOSIS — M549 Dorsalgia, unspecified: Secondary | ICD-10-CM

## 2022-07-05 ENCOUNTER — Ambulatory Visit: Payer: Medicaid Other | Admitting: Internal Medicine

## 2022-07-05 NOTE — Progress Notes (Deleted)
Name: Melinda Charles  MRN/ DOB: [id number], 31-Aug-1965    Age/ Sex: 56 y.o., female    PCP: Verlon Au, MD   Reason for Endocrinology Evaluation: Hyperthyroidism     Date of Initial Endocrinology Evaluation: 06/27/2022    HPI: Melinda Charles is a 56 y.o. female with a past medical history of GERD and Hyperthyroidism. The patient presented for initial endocrinology clinic visit on 06/27/2022 for consultative assistance with her Hyperthyroidism.   Transferred care from Dr. Marlowe Kays with [facility]   She was diagnosed with hyperthyroidism in 09/2018 . This has  been attributed to Graves' disease with a TSI elevated at 4 . This was diagnosed during evaluation  for Vertigo , she was also having panic attacks at the time.     She has been on Methimazole from 2020 , she self discontinued a few months later    No Fh of thyroid disease   SUBJECTIVE:    Today (07/05/22):  Melinda Charles is here for a follow up on hyperthyroidism  Denies weight loss  Denies loose stools or diarrhea  Occasional  palpitations associated with neck spasms  Denies hand tremors  Panic attacks slightly better   HISTORY:  Past Medical History:  Past Medical History:  Diagnosis Date   Anxiety    Phreesia 06/13/2020   Gastroesophageal reflux disease without esophagitis 09/27/2015   Last Assessment & Plan:  2 month history of symptoms Supplies sent to obtain stool sample for H pylori test Trial H2blocker or PPI/reflux precautions Last Assessment & Plan:  2 month history of symptoms Supplies sent to obtain stool sample for H pylori test Trial H2blocker or PPI/reflux precautions   History of Helicobacter pylori infection    Hyperthyroidism    Varicose veins of leg with pain 04/01/2014   Last Assessment & Plan:  Generalized achiness related to bilateral varicose veins.  Discussed support stockings, extremity elevation, both of which she has done in the past.  She is interested in  procedures to alleviate the pain. Last Assessment & Plan:  Generalized achiness related to bilateral varicose veins.  Discussed support stockings, extremity elevation, both of which she has done in the pa   Past Surgical History: No past surgical history on file.  Social History:  reports that she has never smoked. She has never used smokeless tobacco. She reports that she does not drink alcohol and does not use drugs. Family History: family history includes Diabetes in her maternal grandfather; Emphysema in her father; GER disease in her maternal uncle; Heart disease in her paternal grandfather; Hypertension in her father, maternal grandmother, mother, and paternal grandmother.   HOME MEDICATIONS: Allergies as of 07/05/2022   No Known Allergies      Medication List        Accurate as of July 05, 2022  7:30 AM. If you have any questions, ask your nurse or doctor.          acetaminophen-codeine 300-30 MG tablet Commonly known as: TYLENOL #3 Take 1 tablet by mouth as needed.   cyclobenzaprine 10 MG tablet Commonly known as: FLEXERIL Take 1 tablet (10 mg total) by mouth at bedtime as needed for muscle spasms.   ibuprofen 800 MG tablet Commonly known as: ADVIL as needed.   multivitamin tablet Take 1 tablet by mouth daily.   VITAMIN B COMPLEX PO Take by mouth.   VITAMIN C PO Take by mouth.   VITAMIN D PO Take by mouth.  REVIEW OF SYSTEMS: A comprehensive ROS was conducted with the patient and is negative except as per HPI    OBJECTIVE:  VS: There were no vitals taken for this visit.   Wt Readings from Last 3 Encounters:  06/29/21 168 lb (76.2 kg)  05/07/21 176 lb 3.2 oz (79.9 kg)  09/18/20 167 lb 9.6 oz (76 kg)     EXAM: General: Pt appears well and is in NAD  Eyes: External eye exam normal without stare, lid lag or exophthalmos.  EOM intact. .  Neck: General: Supple without adenopathy. Thyroid: Thyroid size normal.  No goiter or nodules  appreciated.  Lungs: Clear with good BS bilat with no rales, rhonchi, or wheezes  Heart: Auscultation: RRR.  Abdomen: Normoactive bowel sounds, soft, nontender, without masses or organomegaly palpable  Extremities:  BL LE: No pretibial edema normal ROM and strength.  Mental Status: Judgment, insight: Intact Orientation: Oriented to time, place, and person Mood and affect: No depression, anxiety, or agitation     DATA REVIEWED: Results for Melinda, Charles (MRN [id number]) as of 06/29/2021 16:16  Ref. Range 06/29/2021 09:26  TSH Latest Ref Range: 0.35 - 5.50 uIU/mL 2.82  T4,Free(Direct) Latest Ref Range: 0.60 - 1.60 ng/dL 9.38      ASSESSMENT/PLAN/RECOMMENDATIONS:   1. Graves' Disease:   -Patient is clinically euthyroid -No local neck symptoms -TFTs are normal which indicates patient is in remission -She has been off methimazole for 2 years -Reassurance provided, patient understands that Graves' disease could relapse at any time and there is no prediction of timing, emphasized the importance of managing stress properly  Follow-up in 1 year   Signed electronically by: Lyndle Herrlich, MD  [facility] Endocrinology  [facility] [address] [place] [address] [place], [place] [address] Phone: [phone] FAX: [phone]   CC: Verlon Au, MD [address] [place] [address] Phone: [phone] Fax: [phone]   Return to Endocrinology clinic as below: Future Appointments  Date Time Provider Department Center  07/05/2022  8:50 AM Melinda Charles, Konrad Dolores, MD [id number] None

## 2022-08-23 DIAGNOSIS — J0141 Acute recurrent pansinusitis: Secondary | ICD-10-CM | POA: Diagnosis not present

## 2022-08-23 DIAGNOSIS — J060 Acute laryngopharyngitis: Secondary | ICD-10-CM | POA: Diagnosis not present

## 2022-08-23 DIAGNOSIS — J208 Acute bronchitis due to other specified organisms: Secondary | ICD-10-CM | POA: Diagnosis not present

## 2022-08-23 DIAGNOSIS — B9689 Other specified bacterial agents as the cause of diseases classified elsewhere: Secondary | ICD-10-CM | POA: Diagnosis not present

## 2022-09-23 ENCOUNTER — Ambulatory Visit: Payer: Medicaid Other | Admitting: Internal Medicine

## 2022-09-23 NOTE — Progress Notes (Deleted)
Office Visit Note  Patient: Melinda Charles             Date of Birth: 06/19/1966           MRN: [id number]             PCP: Bartholome Bill, MD Referring: Bartholome Bill, MD Visit Date: 09/23/2022   Subjective:  No chief complaint on file.   History of Present Illness: Melinda Charles is a 57 y.o. female here for follow up ***   Previous HPI 05/07/21 Melinda Charles is a 57 y.o. female here for follow up with some ongoing problems with pain mostly in the neck and upper back.  She continues feeling sometimes lumps or nodules in the affected areas.  She also has headaches and fatigue these are doing somewhat worse as well as the pain and stiffness.  Her symptoms are very distracting sometimes disruptive for sleep and rest due to pain or discomfort.  She had MRI of the cervical spine checked with some mild abnormalities recommendation for orthopedic surgery follow-up to evaluate this.   Previous HPI 09/18/20 Melinda Charles is a 57 y.o. female with a history of hyperthyroidism, anxiety, palpitations here for evaluation of arthralgias especially cervicalgia and upper back muscle pain. These symptoms are ongoing for the past year without any specific infection, medication change, or injuries prior to onset. She has noticed multiple knots or lumps over her back that are tender and have improved sometimes with stretching but often case more pain worse than it started when she tries to exercise. She also has persistent tenderness on the neck. She does not notice much problem in the lower extremities.   She also sometimes experiences dizziness sensation and was evaluated by a specialist for vertigo with no problems found in the ear and vestibular system. She denies significant sleep disorder or bad fatigue except when having pain. She has chronic constipation without any history of complications. She does not have problems with headaches.   She also reports right  wrist swelling and pain since a few months ago she thinks it was injured in some fashion but not a typical forward fall. It partially improved but has remained swollen and painful with movement for months.     Labs reviewed 07/2020 ESR 12 CRP <5 RA neg   No Rheumatology ROS completed.   PMFS History:  Patient Active Problem List   Diagnosis Date Noted   Graves disease 06/29/2021   Pain and swelling of right wrist 09/18/2020   Upper back pain 09/18/2020   Neck pain 07/18/2020   Hyperthyroidism 10/23/2018   Special screening for malignant neoplasms, colon 0000000   History of Helicobacter pylori infection 02/11/2018   Palpitations 01/22/2018   Unsteadiness 12/30/2017   Gastroesophageal reflux disease without esophagitis 09/27/2015   Corn of foot 04/22/2014   Varicose veins of leg with pain 04/01/2014    Past Medical History:  Diagnosis Date   Anxiety    Phreesia 06/13/2020   Gastroesophageal reflux disease without esophagitis 09/27/2015   Last Assessment & Plan:  2 month history of symptoms Supplies sent to obtain stool sample for H pylori test Trial H2blocker or PPI/reflux precautions Last Assessment & Plan:  2 month history of symptoms Supplies sent to obtain stool sample for H pylori test Trial H2blocker or PPI/reflux precautions   History of Helicobacter pylori infection    Hyperthyroidism    Varicose veins of leg with pain 04/01/2014   Last Assessment & Plan:  Generalized  achiness related to bilateral varicose veins.  Discussed support stockings, extremity elevation, both of which she has done in the past.  She is interested in procedures to alleviate the pain. Last Assessment & Plan:  Generalized achiness related to bilateral varicose veins.  Discussed support stockings, extremity elevation, both of which she has done in the pa    Family History  Problem Relation Age of Onset   Hypertension Mother    Emphysema Father    Hypertension Father    Hypertension Maternal  Grandmother    Diabetes Maternal Grandfather    Hypertension Paternal Grandmother    Heart disease Paternal Grandfather    GER disease Maternal Uncle    No past surgical history on file. Social History   Social History Narrative   Not on file    There is no immunization history on file for this patient.   Objective: Vital Signs: There were no vitals taken for this visit.   Physical Exam   Musculoskeletal Exam: ***  CDAI Exam: CDAI Score: -- Patient Global: --; Provider Global: -- Swollen: --; Tender: -- Joint Exam 09/23/2022   No joint exam has been documented for this visit   There is currently no information documented on the homunculus. Go to the Rheumatology activity and complete the homunculus joint exam.  Investigation: No additional findings.  Imaging: No results found.  Recent Labs: Lab Results  Component Value Date   WBC 6.2 11/24/2019   HGB 12.7 11/24/2019   PLT 267 11/24/2019   NA 141 12/28/2019   K 4.3 12/28/2019   CL 102 12/28/2019   CO2 25 12/28/2019   GLUCOSE 108 (H) 11/24/2019   BUN 13 11/24/2019   CREATININE 0.63 11/24/2019   BILITOT 0.2 11/24/2019   ALKPHOS 72 11/24/2019   AST 18 11/24/2019   ALT 10 11/24/2019   PROT 7.2 11/24/2019   ALBUMIN 4.7 11/24/2019   CALCIUM 9.5 11/24/2019   GFRAA 119 11/24/2019    Speciality Comments: No specialty comments available.  Procedures:  No procedures performed Allergies: Patient has no known allergies.   Assessment / Plan:     Visit Diagnoses: No diagnosis found.  ***  Orders: No orders of the defined types were placed in this encounter.  No orders of the defined types were placed in this encounter.    Follow-Up Instructions: No follow-ups on file.   Collier Salina, MD  Note - This record has been created using [employer].  Chart creation errors have been sought, but may not always  have been located. Such creation errors do not reflect on  the standard of medical  care.

## 2022-10-23 ENCOUNTER — Ambulatory Visit: Payer: Medicaid Other | Attending: Internal Medicine | Admitting: Internal Medicine

## 2022-10-23 ENCOUNTER — Encounter: Payer: Self-pay | Admitting: Internal Medicine

## 2022-10-23 VITALS — BP 96/62 | HR 82 | Resp 12 | Ht 64.0 in | Wt 170.0 lb

## 2022-10-23 DIAGNOSIS — G8929 Other chronic pain: Secondary | ICD-10-CM | POA: Insufficient documentation

## 2022-10-23 DIAGNOSIS — M7918 Myalgia, other site: Secondary | ICD-10-CM

## 2022-10-23 DIAGNOSIS — M549 Dorsalgia, unspecified: Secondary | ICD-10-CM | POA: Diagnosis not present

## 2022-10-23 NOTE — Patient Instructions (Signed)
I recommend checking out the [facility] patient-centered guide for fibromyalgia and chronic pain management: [email or web address]   Myofascial Pain Syndrome and Fibromyalgia Myofascial pain syndrome and fibromyalgia are both pain disorders. You may feel this pain mainly in your muscles. Myofascial pain syndrome: Always has tender points in the muscles that will cause pain when pressed (trigger points). The pain may come and go. Usually affects your neck, upper back, and shoulder areas. The pain often moves into your arms and hands. Fibromyalgia: Has muscle pains and tenderness that come and go. Is often associated with tiredness (fatigue) and sleep problems. Has trigger points. Tends to be long-lasting (chronic), but is not life-threatening. Fibromyalgia and myofascial pain syndrome are not the same. However, they often occur together. If you have both conditions, each can make the other worse. Both are common and can cause enough pain and fatigue to make day-to-day activities difficult. Both can be hard to diagnose because their symptoms are common in many other conditions. What are the causes? The exact causes of these conditions are not known. What increases the risk? You are more likely to develop either of these conditions if: You have a family history of the condition. You are female. You have certain triggers, such as: Spine disorders. An injury (trauma) or other physical stressors. Being under a lot of stress. Medical conditions such as osteoarthritis, rheumatoid arthritis, or lupus. What are the signs or symptoms? Fibromyalgia The main symptom of fibromyalgia is widespread pain and tenderness in your muscles. Pain is sometimes described as stabbing, shooting, or burning. You may also have: Tingling or numbness. Sleep problems and fatigue. Problems with attention and concentration (fibro fog). Other symptoms may include: Bowel and bladder  problems. Headaches. Vision problems. Sensitivity to odors and noises. Depression or mood changes. Painful menstrual periods (dysmenorrhea). Dry skin or eyes. These symptoms can vary over time. Myofascial pain syndrome Symptoms of myofascial pain syndrome include: Tight, ropy bands of muscle. Uncomfortable sensations in muscle areas. These may include aching, cramping, burning, numbness, tingling, and weakness. Difficulty moving certain parts of the body freely (poor range of motion). How is this diagnosed? This condition may be diagnosed by your symptoms and medical history. You will also have a physical exam. In general: Fibromyalgia is diagnosed if you have pain, fatigue, and other symptoms for more than 3 months, and symptoms cannot be explained by another condition. Myofascial pain syndrome is diagnosed if you have trigger points in your muscles, and those trigger points are tender and cause pain elsewhere in your body (referred pain). How is this treated? Treatment for these conditions depends on the type that you have. For fibromyalgia, a healthy lifestyle is the most important treatment including aerobic and strength exercises. Different types of medicines are used to help treat pain and include: NSAIDs. Medicines for treating depression. Medicines that help control seizures. Medicines that relax the muscles. Treatment for myofascial pain syndrome includes: Pain medicines, such as NSAIDs. Cooling and stretching of muscles. Massage therapy with myofascial release technique. Trigger point injections. Treating these conditions often requires a team of health care providers. These may include: Your primary care provider. A physical therapist. Complementary health care providers, such as massage therapists or acupuncturists. A psychiatrist for cognitive behavioral therapy. Follow these instructions at home: Medicines Take over-the-counter and prescription medicines only as told  by your health care provider. Ask your health care provider if the medicine prescribed to you: Requires you to avoid driving or using machinery. Can cause constipation. You  may need to take these actions to prevent or treat constipation: Drink enough fluid to keep your urine pale yellow. Take over-the-counter or prescription medicines. Eat foods that are high in fiber, such as beans, whole grains, and fresh fruits and vegetables. Limit foods that are high in fat and processed sugars, such as fried or sweet foods. Lifestyle  Do exercises as told by your health care provider or physical therapist. Practice relaxation techniques to control your stress. You may want to try: Biofeedback. Visual imagery. Hypnosis. Muscle relaxation. Yoga. Meditation. Maintain a healthy lifestyle. This includes eating a healthy diet and getting enough sleep. Do not use any products that contain nicotine or tobacco. These products include cigarettes, chewing tobacco, and vaping devices, such as e-cigarettes. If you need help quitting, ask your health care provider. General instructions Talk to your health care provider about complementary treatments, such as acupuncture or massage. Do not do activities that stress or strain your muscles. This includes repetitive motions and heavy lifting. Keep all follow-up visits. This is important. Where to find support Consider joining a support group with others who are diagnosed with this condition. National Fibromyalgia Association: fmaware.org Where to find more information U.S. Pain Foundation: uspainfoundation.org Contact a health care provider if: You have new symptoms. Your symptoms get worse or your pain is severe. You have side effects from your medicines. You have trouble sleeping. Your condition is causing depression or anxiety. Get help right away if: You have thoughts of hurting yourself or others. Get help right away if you feel like you may hurt  yourself or others, or have thoughts about taking your own life. Go to your nearest emergency room or: Call 911. Call the [facility] at [phone] or 988. This is open 24 hours a day. Text the Crisis Text Line at 2566349114. This information is not intended to replace advice given to you by your health care provider. Make sure you discuss any questions you have with your health care provider. Document Revised: 05/20/2022 Document Reviewed: 07/13/2021 Elsevier Patient Education  [place].

## 2022-10-23 NOTE — Progress Notes (Signed)
Office Visit Note  Patient: Melinda Charles             Date of Birth: 04/01/1966           MRN: [id number]             PCP: Bartholome Bill, MD Referring: Bartholome Bill, MD Visit Date: 10/23/2022   Subjective:  Follow-up (Patient states tightness in her upper back, shoulders, and neck. )   History of Present Illness: Melinda Charles is a 57 y.o. female here for follow up after a long interval both mostly the same ongoing problem of pain and stiffness with very tight muscles throughout her upper back shoulders and neck.  She does not recall whether there was ever a large difference with the trial of muscle relaxer medication.  She takes ibuprofen intermittently which is partially helpful.  She has been trying to resume regular physical exercise and attending the gym but having a lot of trouble.  She gets slight relief once up and moving but frequently has exacerbations taking a day or longer to recover if she overexerts herself.  Sometimes with muscle spasms but often just stiff and achiness without any specific trigger.  Does not have much of this affecting lower back and extremities.  Previous HPI 05/07/21 Melinda Charles is a 57 y.o. female here for follow up with some ongoing problems with pain mostly in the neck and upper back.  She continues feeling sometimes lumps or nodules in the affected areas.  She also has headaches and fatigue these are doing somewhat worse as well as the pain and stiffness.  Her symptoms are very distracting sometimes disruptive for sleep and rest due to pain or discomfort.  She had MRI of the cervical spine checked with some mild abnormalities recommendation for orthopedic surgery follow-up to evaluate this.   Previous HPI 09/18/20 Melinda Charles is a 57 y.o. female with a history of hyperthyroidism, anxiety, palpitations here for evaluation of arthralgias especially cervicalgia and upper back muscle pain. These symptoms are  ongoing for the past year without any specific infection, medication change, or injuries prior to onset. She has noticed multiple knots or lumps over her back that are tender and have improved sometimes with stretching but often case more pain worse than it started when she tries to exercise. She also has persistent tenderness on the neck. She does not notice much problem in the lower extremities.   She also sometimes experiences dizziness sensation and was evaluated by a specialist for vertigo with no problems found in the ear and vestibular system. She denies significant sleep disorder or bad fatigue except when having pain. She has chronic constipation without any history of complications. She does not have problems with headaches.   She also reports right wrist swelling and pain since a few months ago she thinks it was injured in some fashion but not a typical forward fall. It partially improved but has remained swollen and painful with movement for months.     Labs reviewed 07/2020 ESR 12 CRP <5 RA neg     Review of Systems  Constitutional:  Negative for fatigue.  HENT:  Negative for mouth sores and mouth dryness.   Eyes:  Negative for dryness.  Respiratory:  Negative for shortness of breath.   Cardiovascular:  Positive for palpitations. Negative for chest pain.  Gastrointestinal:  Negative for blood in stool, constipation and diarrhea.  Endocrine: Negative for increased urination.  Genitourinary:  Negative for involuntary urination.  Musculoskeletal:  Positive for joint pain, gait problem, joint pain, myalgias, morning stiffness, muscle tenderness and myalgias. Negative for joint swelling and muscle weakness.  Skin:  Negative for color change, rash, hair loss and sensitivity to sunlight.  Allergic/Immunologic: Negative for susceptible to infections.  Neurological:  Negative for dizziness and headaches.  Hematological:  Negative for swollen glands.  Psychiatric/Behavioral:  Negative  for depressed mood and sleep disturbance. The patient is nervous/anxious.     PMFS History:  Patient Active Problem List   Diagnosis Date Noted   Chronic myofascial pain 10/23/2022   Graves disease 06/29/2021   Pain and swelling of right wrist 09/18/2020   Upper back pain 09/18/2020   Neck pain 07/18/2020   Hyperthyroidism 10/23/2018   Special screening for malignant neoplasms, colon 0000000   History of Helicobacter pylori infection 02/11/2018   Palpitations 01/22/2018   Unsteadiness 12/30/2017   Gastroesophageal reflux disease without esophagitis 09/27/2015   Corn of foot 04/22/2014   Varicose veins of leg with pain 04/01/2014    Past Medical History:  Diagnosis Date   Anxiety    Phreesia 06/13/2020   Gastroesophageal reflux disease without esophagitis 09/27/2015   Last Assessment & Plan:  2 month history of symptoms Supplies sent to obtain stool sample for H pylori test Trial H2blocker or PPI/reflux precautions Last Assessment & Plan:  2 month history of symptoms Supplies sent to obtain stool sample for H pylori test Trial H2blocker or PPI/reflux precautions   History of Helicobacter pylori infection    Hyperthyroidism    Varicose veins of leg with pain 04/01/2014   Last Assessment & Plan:  Generalized achiness related to bilateral varicose veins.  Discussed support stockings, extremity elevation, both of which she has done in the past.  She is interested in procedures to alleviate the pain. Last Assessment & Plan:  Generalized achiness related to bilateral varicose veins.  Discussed support stockings, extremity elevation, both of which she has done in the pa    Family History  Problem Relation Age of Onset   Hypertension Mother    Emphysema Father    Hypertension Father    Hypertension Maternal Grandmother    Diabetes Maternal Grandfather    Hypertension Paternal Grandmother    Heart disease Paternal Grandfather    GER disease Maternal Uncle    History reviewed. No  pertinent surgical history. Social History   Social History Narrative   Not on file    There is no immunization history on file for this patient.   Objective: Vital Signs: BP 96/62 (BP Location: Left Arm, Patient Position: Sitting, Cuff Size: Normal)   Pulse 82   Resp 12   Ht '5\' 4"'$  (1.626 m)   Wt 170 lb (77.1 kg)   BMI 29.18 kg/m    Physical Exam Cardiovascular:     Rate and Rhythm: Normal rate and regular rhythm.  Pulmonary:     Effort: Pulmonary effort is normal.     Breath sounds: Normal breath sounds.  Skin:    General: Skin is warm and dry.  Neurological:     Mental Status: She is alert.  Psychiatric:        Mood and Affect: Mood normal.      Musculoskeletal Exam:  Paraspinal and diffuse muscle tenderness to pressure across upper and mid back, palpable knots in muscle, mild radiation along back none into arms No diffuse tenderness to pressure in legs or in distal arms Elbows full ROM no tenderness or swelling Wrists full ROM  no tenderness or swelling Fingers full ROM no tenderness or swelling Knees full ROM no tenderness or swelling   Investigation: No additional findings.  Imaging: No results found.  Recent Labs: Lab Results  Component Value Date   WBC 6.2 11/24/2019   HGB 12.7 11/24/2019   PLT 267 11/24/2019   NA 141 12/28/2019   K 4.3 12/28/2019   CL 102 12/28/2019   CO2 25 12/28/2019   GLUCOSE 108 (H) 11/24/2019   BUN 13 11/24/2019   CREATININE 0.63 11/24/2019   BILITOT 0.2 11/24/2019   ALKPHOS 72 11/24/2019   AST 18 11/24/2019   ALT 10 11/24/2019   PROT 7.2 11/24/2019   ALBUMIN 4.7 11/24/2019   CALCIUM 9.5 11/24/2019   GFRAA 119 11/24/2019    Speciality Comments: No specialty comments available.  Procedures:  No procedures performed Allergies: Patient has no known allergies.   Assessment / Plan:     Visit Diagnoses: Chronic myofascial pain - Plan: Ambulatory referral to Physical Medicine Rehab  Symptoms are highly consistent  with chronic myofascial pain with multiple tender points palpable muscle knots and bundles and without much abnormality in joint function and range of motion.  Discussed use of low-dose or intermittent NSAIDs as okay.  Provided printed information on myofascial pain and recommended to review fibroguide page on some self treatment recommendations.  She is interested in trigger point injection or other therapies will refer to PM&R.    Orders: Orders Placed This Encounter  Procedures   Ambulatory referral to Physical Medicine Rehab   No orders of the defined types were placed in this encounter.    Follow-Up Instructions: No follow-ups on file.   Collier Salina, MD  Note - This record has been created using [employer].  Chart creation errors have been sought, but may not always  have been located. Such creation errors do not reflect on  the standard of medical care.

## 2022-11-13 ENCOUNTER — Encounter: Payer: Self-pay | Admitting: Physical Medicine and Rehabilitation

## 2022-12-03 ENCOUNTER — Encounter
Payer: Medicaid Other | Attending: Physical Medicine and Rehabilitation | Admitting: Physical Medicine and Rehabilitation

## 2023-01-14 ENCOUNTER — Encounter
Payer: Medicaid Other | Attending: Physical Medicine and Rehabilitation | Admitting: Physical Medicine and Rehabilitation

## 2023-01-14 VITALS — BP 110/73 | HR 62 | Ht 64.0 in | Wt 173.0 lb

## 2023-01-14 DIAGNOSIS — G8929 Other chronic pain: Secondary | ICD-10-CM | POA: Diagnosis not present

## 2023-01-14 DIAGNOSIS — M7918 Myalgia, other site: Secondary | ICD-10-CM | POA: Insufficient documentation

## 2023-01-14 NOTE — Progress Notes (Signed)
Subjective:    Patient ID: Melinda Charles, female    DOB: 1966/03/31, 57 y.o.   MRN: [id number]  HPI Melinda Charles is a 57 year old woman who presents to establish care for myofascial pain.  1) Myfoascial pain -been present for a long time -she has heard about dry needling -has a history of working out -pain was worse after working out  -she has a heating pad at home   Pain Inventory Average Pain 7 Pain Right Now 5 My pain is intermittent, dull, and aching  In the last 24 hours, has pain interfered with the following? General activity 1 Relation with others 2 Enjoyment of life 7 What TIME of day is your pain at its worst? morning  Sleep (in general) Good  Pain is worse with: some activites Pain improves with: medication Relief from Meds: 5  how many minutes can you walk? 60 ability to climb steps?  yes do you drive?  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  spasms  New pt  New pt    Family History  Problem Relation Age of Onset   Hypertension Mother    Emphysema Father    Hypertension Father    Hypertension Maternal Grandmother    Diabetes Maternal Grandfather    Hypertension Paternal Grandmother    Heart disease Paternal Grandfather    GER disease Maternal Uncle    Social History   Socioeconomic History   Marital status: Single    Spouse name: Not on file   Number of children: Not on file   Years of education: Not on file   Highest education level: Not on file  Occupational History   Not on file  Tobacco Use   Smoking status: Never    Passive exposure: Never   Smokeless tobacco: Never  Vaping Use   Vaping Use: Never used  Substance and Sexual Activity   Alcohol use: Never   Drug use: Never   Sexual activity: Not on file  Other Topics Concern   Not on file  Social History Narrative   Not on file   Social Determinants of Health   Financial Resource Strain: Not on file  Food Insecurity: Not on file  Transportation  Needs: Not on file  Physical Activity: Not on file  Stress: Not on file  Social Connections: Not on file   No past surgical history on file. Past Medical History:  Diagnosis Date   Anxiety    Phreesia 06/13/2020   Gastroesophageal reflux disease without esophagitis 09/27/2015   Last Assessment & Plan:  2 month history of symptoms Supplies sent to obtain stool sample for H pylori test Trial H2blocker or PPI/reflux precautions Last Assessment & Plan:  2 month history of symptoms Supplies sent to obtain stool sample for H pylori test Trial H2blocker or PPI/reflux precautions   History of Helicobacter pylori infection    Hyperthyroidism    Varicose veins of leg with pain 04/01/2014   Last Assessment & Plan:  Generalized achiness related to bilateral varicose veins.  Discussed support stockings, extremity elevation, both of which she has done in the past.  She is interested in procedures to alleviate the pain. Last Assessment & Plan:  Generalized achiness related to bilateral varicose veins.  Discussed support stockings, extremity elevation, both of which she has done in the pa   BP 110/73   Pulse 62   Ht 5\' 4"  (1.626 m)   Wt 173 lb (78.5 kg)   SpO2 100%  BMI 29.70 kg/m   Opioid Risk Score:   Fall Risk Score:  `1  Depression screen [facility] 2/9     01/14/2023    1:41 PM 11/30/2019    3:08 PM 11/04/2019    1:51 PM  Depression screen PHQ 2/9  Decreased Interest 0 0 0  Down, Depressed, Hopeless 0 0   PHQ - 2 Score 0 0 0  Altered sleeping 0 0   Tired, decreased energy 1 0   Change in appetite 0 0   Feeling bad or failure about yourself  0 1   Trouble concentrating 0 0   Moving slowly or fidgety/restless 0 0   Suicidal thoughts 0 0   PHQ-9 Score 1 1   Difficult doing work/chores Not difficult at all       Review of Systems  Musculoskeletal:  Positive for neck pain.       Shoulder pain  All other systems reviewed and are negative.      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  -Discussed benefits of exercise in reducing pain. -Prescribed Zynex Nexwave -Discussed following foods that may reduce pain: 1) Ginger (especially studied for arthritis)- reduce leukotriene production to decrease inflammation 2) Blueberries- high in phytonutrients that decrease inflammation 3) Salmon- marine omega-3s reduce joint swelling and pain 4) Pumpkin seeds- reduce inflammation 5) dark chocolate- reduces inflammation 6) turmeric- reduces inflammation 7) tart cherries - reduce pain and stiffness 8) extra virgin olive oil - its compound olecanthal helps to block prostaglandins  9) chili peppers- can be eaten or applied topically via capsaicin 10) mint- helpful for headache, muscle aches, joint pain, and itching 11) garlic- reduces inflammation  Link to further information on diet for chronic pain: [email or web address]

## 2023-02-11 ENCOUNTER — Encounter: Payer: Self-pay | Admitting: Physical Therapy

## 2023-02-11 ENCOUNTER — Ambulatory Visit: Payer: Medicaid Other | Attending: Physical Medicine and Rehabilitation | Admitting: Physical Therapy

## 2023-02-11 DIAGNOSIS — M542 Cervicalgia: Secondary | ICD-10-CM | POA: Insufficient documentation

## 2023-02-11 DIAGNOSIS — M545 Low back pain, unspecified: Secondary | ICD-10-CM | POA: Insufficient documentation

## 2023-02-11 DIAGNOSIS — M7918 Myalgia, other site: Secondary | ICD-10-CM | POA: Diagnosis not present

## 2023-02-11 DIAGNOSIS — R293 Abnormal posture: Secondary | ICD-10-CM | POA: Insufficient documentation

## 2023-02-11 DIAGNOSIS — G8929 Other chronic pain: Secondary | ICD-10-CM | POA: Insufficient documentation

## 2023-02-11 DIAGNOSIS — M6281 Muscle weakness (generalized): Secondary | ICD-10-CM | POA: Insufficient documentation

## 2023-02-11 NOTE — Patient Instructions (Signed)
Aquatic Therapy at [facility]-  What to Expect!  Where:   [facility] [place], [place] [address] Rehab phone [phone]  NOTE:  You will receive an automated phone message reminding you of your appt and it will say the appointment is at the [facility].          How to Prepare: Please make sure you drink 8 ounces of water about one hour prior to your pool session A caregiver may attend if needed with the patient to help assist as needed. A caregiver can sit in the pool room on chair. Please arrive IN YOUR SUIT and 15 minutes prior to your appointment - this helps to avoid delays in starting your session. Please make sure to attend to any toileting needs prior to entering the pool Bowie rooms for changing are provided.   There is direct access to the pool deck form the locker room.  You can lock your belongings in a locker with lock provided. Once on the pool deck your therapist will ask if you have signed the Patient  Consent and Assignment of Benefits form before beginning treatment Your therapist may take your blood pressure prior to, during and after your session if indicated We usually try and create a home exercise program based on activities we do in the pool.  Please be thinking about who might be able to assist you in the pool should you need to participate in an aquatic home exercise program at the time of discharge if you need assistance.  Some patients do not want to or do not have the ability to participate in an aquatic home program - this is not a barrier in any way to you participating in aquatic therapy as part of your current therapy plan! After Discharge from PT, you can continue using home program at  the [facility], there is a drop-in fee for $5 ($45 a month)or for 60 years  or older $4.00 ($40 a month for seniors ) or any local [facility] pool.  Memberships for purchase are  available for gym/pool at [facility] LAST AQUATIC VISIT.  PLEASE MAKE SURE THAT YOU HAVE A [address]  APPOINTMENT SCHEDULED.   About the pool: Pool is located approximately 500 FT from the entrance of the building.  Please bring a support person if you need assistance traveling this      distance.   Your therapist will assist you in entering the water; there are two ways to           enter: stairs with railings, and a mechanical lift. Your therapist will determine the most appropriate way for you.  Water temperature is usually between 88-90 degrees  There may be up to 2 other swimmers in the pool at the same time  The pool deck is tile, please wear shoes with good traction if you prefer not to be barefoot.    Contact Info:  For appointment scheduling and cancellations:         Please call the [facility]       All sessions are 45 minutes  Garen Lah, PT, ATRIC Certified Exercise Expert for the Aging Adult  02/11/23 11:47 AM Phone: [phone] Fax: [phone]

## 2023-02-11 NOTE — Therapy (Addendum)
OUTPATIENT PHYSICAL THERAPY THORACOLUMBAR EVALUATION   Patient Name: Melinda Charles MRN: [id number] DOB:May 24, 1966, 57 y.o., female Today's Date: 02/11/2023  END OF SESSION:  PT End of Session - 02/11/23 1115     Visit Number 1    Number of Visits 12    Date for PT Re-Evaluation 03/25/23    Authorization Type [employer]    Authorization Time Period waiting authorization approval    PT Start Time 1104    PT Stop Time 1145    PT Time Calculation (min) 41 min             Past Medical History:  Diagnosis Date   Anxiety    [employer] 06/13/2020   Gastroesophageal reflux disease without esophagitis 09/27/2015   Last Assessment & Plan:  2 month history of symptoms Supplies sent to obtain stool sample for H pylori test Trial H2blocker or PPI/reflux precautions Last Assessment & Plan:  2 month history of symptoms Supplies sent to obtain stool sample for H pylori test Trial H2blocker or PPI/reflux precautions   History of Helicobacter pylori infection    Hyperthyroidism    Varicose veins of leg with pain 04/01/2014   Last Assessment & Plan:  Generalized achiness related to bilateral varicose veins.  Discussed support stockings, extremity elevation, both of which she has done in the past.  She is interested in procedures to alleviate the pain. Last Assessment & Plan:  Generalized achiness related to bilateral varicose veins.  Discussed support stockings, extremity elevation, both of which she has done in the pa   History reviewed. No pertinent surgical history. Patient Active Problem List   Diagnosis Date Noted   Chronic myofascial pain 10/23/2022   Graves disease 06/29/2021   Pain and swelling of right wrist 09/18/2020   Upper back pain 09/18/2020   Neck pain 07/18/2020   Hyperthyroidism 10/23/2018   Special screening for malignant neoplasms, colon 02/11/2018   History of Helicobacter pylori infection 02/11/2018   Palpitations 01/22/2018   Unsteadiness 12/30/2017    Gastroesophageal reflux disease without esophagitis 09/27/2015   Corn of foot 04/22/2014   Varicose veins of leg with pain 04/01/2014    PCP: Melinda Au, MD   REFERRING PROVIDER: Horton Chin, MD   REFERRING DIAG: [phone] (ICD-10-CM) - Chronic myofascial pain   Rationale for Evaluation and Treatment: Rehabilitation  THERAPY DIAG:  Cervicalgia  Muscle weakness (generalized)  Chronic bilateral low back pain without sciatica  ONSET DATE: Two years of chronic myofascial  SUBJECTIVE:  SUBJECTIVE STATEMENT: I have a history of working out with weight training and cardio.  I have stopped exercising because of the pain I deal with afterwards. I have a special needs son with Melinda Charles that I care for. He is 56 yo. I have a stationary bike I use. I am not in a routine walking program.  I notice I have so much more pain after I exercise  PERTINENT HISTORY:  Anxiety, GERD, Hyperthyroidism, varicose veins, vertigo in past  PAIN:  Are you having pain? Yes: NPRS scale: at rest 3/10  but at worst after exercising 9/10 Pain location: all over body pain but mostly in upper shoulders and neck and low back Pain description: intermittent aching and muscle cramping pain Aggravating factors: Exercise like weight training, or lifting  carrying bags for groceries caring for my special needs son with downs syndrome Relieving factors: Not exercising  PRECAUTIONS: None  WEIGHT BEARING RESTRICTIONS: No  FALLS:  Has patient fallen in last 6 months? No  LIVING ENVIRONMENT: Lives with: lives with their family Lives in: House/apartment Stairs:  no issues with stairs Has following equipment at home: None  OCCUPATION: SAHM  PLOF: Independent  PATIENT GOALS: Be able to exercise without residual  myofascial pain  NEXT MD VISIT: TBD  OBJECTIVE:   DIAGNOSTIC FINDINGS:  None recent  PATIENT SURVEYS:  FOTO 32% predicted 49%  SCREENING FOR RED FLAGS: Bowel or bladder incontinence: No   COGNITION: Overall cognitive status: Within functional limits for tasks assessed     SENSATION: WFL  MUSCLE LENGTH: Hamstrings: Right 80 deg; Left 75 deg   POSTURE: rounded shoulders, forward head, and anterior pelvic tilt  PALPATION: Overall tenderness , UT tightness bil  BIL UE  WNL CERVICAL ROM:   Active ROM A/PROM (deg) eval  Flexion 35  Extension 59  Right lateral flexion 27  Left lateral flexion 20  Right rotation 49  Left rotation 44   (Blank rows = not tested)    LUMBAR ROM:   AROM eval  Flexion Fingertips to toes  Extension 60%  Right lateral flexion Fingertips to  1 inch above knee jt  Left lateral flexion Fingertips to  1 inch above knee jt  Right rotation 50%  Left rotation 50%   (Blank rows = not tested)  LOWER EXTREMITY ROM:     Active  Right eval Left eval  Hip flexion 90 90  Hip extension    Hip abduction    Hip adduction    Hip internal rotation    Hip external rotation    Knee flexion    Knee extension    Ankle dorsiflexion    Ankle plantarflexion    Ankle inversion    Ankle eversion     (Blank rows = not tested)  LOWER EXTREMITY MMT:    MMT Right eval Left eval  Hip flexion    Hip extension    Hip abduction    Hip adduction    Hip internal rotation    Hip external rotation 30 35  Knee flexion    Knee extension    Ankle dorsiflexion    Ankle plantarflexion    Ankle inversion    Ankle eversion     (Blank rows = not tested)  LUMBAR SPECIAL TESTS:  Straight leg raise test: Negative and Slump test: Negative  FUNCTIONAL TESTS:  5 times sit to stand: 9.66  GAIT: Distance walked: 150 Assistive device utilized: None Level of assistance: Complete Independence Comments: No issues with gait  Eval (02-11-23)      Supine  single leg bridge:  Not good form      Supine bridge   can only hold for 67 sec (normal 120 sec      Sidelying plank from knees: <39'' on L and on R 41" (norm >30'')        10/10 pain after exercise                            TODAY'S TREATMENT:                                                                                                                              DATE: 02-11-23  Eval    PATIENT EDUCATION:  Education details: POC Explanation of findings and FOTO report Person educated: Patient Education method: Explanation and Handouts Education comprehension: verbalized understanding and needs further education  HOME EXERCISE PROGRAM: Access Code: [email or web address] URL: [email or web address] Date: 02/11/2023 Prepared by: Garen Lah  Exercises - Supine Deep Neck Flexor Training  - 2-3 x daily - 7 x weekly - 10 reps - 3 hold - Seated Cervical Retraction Protraction AROM  - 2-3 x daily - 7 x weekly - 1 sets - 10 reps - Seated Gentle Upper Trapezius Stretch  - 1 x daily - 7 x weekly - 1 sets - 3 reps - 30 hold - Gentle Levator Scapulae Stretch  - 1 x daily - 7 x weekly - 3 sets - 3 reps - 30 hold - Supine Pelvic Tilt  - 1 x daily - 7 x weekly - 3 sets - 10 reps - Supine Single Knee to Chest Stretch  - 2 x daily - 7 x weekly - 1 sets - 5 reps - 10 hold - Supine Lower Trunk Rotation  - 2 x daily - 7 x weekly - 1 sets - 5 reps - 20 hold - Supine Bridge  - 1 x daily - 7 x weekly - 2 sets - 10 reps  ASSESSMENT:  CLINICAL IMPRESSION: Patient is a 57 y.o. female who was seen today for physical therapy evaluation and treatment for chronic myofascial pain in neck and low back. Pt has special needs son she cares for and she is not be able to exercise due to chronic myofascial pain.   Pt used to be able to exercise regularly but has not in the past year due to increasing pain up to 10/10 after exercise.  Ms Melinda Charles would like to be able to return to more active life and  manage symptoms of chronic myofascial pain. Pt Melinda benefit from skilled PT to address impairments and educate on HEP and self care for condition.  OBJECTIVE IMPAIRMENTS: decreased ROM, decreased strength, improper body mechanics, postural dysfunction, and pain.   ACTIVITY LIMITATIONS: carrying, lifting, bending, and squatting  PARTICIPATION LIMITATIONS: driving, community activity, and caring for special needs son and exercing for health  PERSONAL FACTORS: Anxiety, GERD, Hyperthyroidism, varicose veins, vertigo in past are also affecting patient's functional outcome.   REHAB POTENTIAL: Good  CLINICAL DECISION MAKING: Evolving/moderate complexity  EVALUATION COMPLEXITY: Moderate   GOALS: Goals reviewed with patient? Yes  SHORT TERM GOALS: Target date: 03-06-23  Pain after exercise Melinda decrease from 10/10 down to 7/10 Baseline: 10/10 and prefers not to exercise due to pain Goal status: INITIAL  2.  Pt Melinda try aquatics to add exercise to routine and develop HEP Baseline: No knowledge of aquatics Goal status: INITIAL  3.  Pt Melinda be independent with [place] initial HEP Baseline: limited knowledge Goal status: INITIAL    LONG TERM GOALS: Target date: 03-28-23  Pt Melinda be independent with advanced HEP and be able to return to gym wt training Baseline: at present haven't returned to gym Goal status: INITIAL  2.  Pt Melinda have 50% decreased tension in cervical and shoulder after exercise Baseline: 10/10 Goal status: INITIAL  3.  FOTO Melinda improve from  32%  to  49%   indicating improved functional mobility.  Baseline: 32% eval Goal status: INITIAL  4.  Pt Melinda be able to carry 25 lbs of groceries without exacerbating pain greater than 3/10 Baseline: Avoids carrying items due to stress on neck Goal status: INITIAL  5.  Demonstrate and verbalize techniques to reduce the risk of re-injury including: lifting, posture, body mechanics.  Baseline: limited knoweldge,  pt with  forward and flexed posture Goal status: INITIAL  6.  Improved cervical rotation to allow checking blind spots while driving with minimal discomfort Baseline: some limitation with muscle tightness and spams Goal status: INITIAL  PLAN:  PT FREQUENCY: 2x/week  PT DURATION: 6 weeks  PLANNED INTERVENTIONS: Therapeutic exercises, Therapeutic activity, Neuromuscular re-education, Balance training, Gait training, Patient/Family education, Self Care, Joint mobilization, Aquatic Therapy, Dry Needling, Electrical stimulation, Cryotherapy, Moist heat, Taping, Manual therapy, and Re-evaluation.  PLAN FOR NEXT SESSION: Issue HEP for neck basic and basic back.and educate patient     Check all possible CPT codes: 16109 - PT Re-evaluation, 97110- Therapeutic Exercise, [phone]- Neuro Re-education, [phone] - Gait Training, [phone] - Manual Therapy, [phone] - Therapeutic Activities, [phone] - Self Care, and [phone] - Electrical stimulation (Manual)    Check all conditions that are expected to impact treatment: {Conditions expected to impact treatment:Musculoskeletal disorders   If treatment provided at initial evaluation, no treatment charged due to lack of authorization.      Garen Lah, PT, ATRIC Certified Exercise Expert for the Aging Adult  02/11/23 1:35 PM Phone: [phone] Fax: [phone]

## 2023-02-18 ENCOUNTER — Encounter: Payer: Medicaid Other | Admitting: Physical Therapy

## 2023-02-26 ENCOUNTER — Ambulatory Visit: Payer: Medicaid Other | Admitting: Physical Therapy

## 2023-03-04 ENCOUNTER — Encounter: Payer: Self-pay | Admitting: Physical Therapy

## 2023-03-04 ENCOUNTER — Ambulatory Visit: Payer: Medicaid Other | Attending: Physical Medicine and Rehabilitation | Admitting: Physical Therapy

## 2023-03-04 DIAGNOSIS — M545 Low back pain, unspecified: Secondary | ICD-10-CM | POA: Insufficient documentation

## 2023-03-04 DIAGNOSIS — G8929 Other chronic pain: Secondary | ICD-10-CM | POA: Diagnosis not present

## 2023-03-04 DIAGNOSIS — M6281 Muscle weakness (generalized): Secondary | ICD-10-CM | POA: Diagnosis not present

## 2023-03-04 DIAGNOSIS — R293 Abnormal posture: Secondary | ICD-10-CM | POA: Insufficient documentation

## 2023-03-04 DIAGNOSIS — M542 Cervicalgia: Secondary | ICD-10-CM | POA: Diagnosis not present

## 2023-03-04 NOTE — Patient Instructions (Signed)

## 2023-03-04 NOTE — Therapy (Signed)
OUTPATIENT PHYSICAL THERAPY THORACOLUMBAR TREATMENT   Patient Name: Melinda Charles MRN: [id number] DOB:02-Oct-1965, 57 y.o., female Today's Date: 03/04/2023  END OF SESSION:  PT End of Session - 03/04/23 1510     Visit Number 2    Number of Visits 12    Date for PT Re-Evaluation 03/25/23    Authorization Type [employer] No Traction, ESTIM unattended, [employer], [place]    Authorization Time Period [employer] - $4.00 Copay  Approved 9 visits 02/17/23-04/17/23    PT Start Time 1505    PT Stop Time 1550    PT Time Calculation (min) 45 min    Activity Tolerance Patient tolerated treatment well;Patient limited by pain    Behavior During Therapy [facility] for tasks assessed/performed              Past Medical History:  Diagnosis Date   Anxiety    [employer]reesia 06/13/2020   Gastroesophageal reflux disease without esophagitis 09/27/2015   Last Assessment & Plan:  2 month history of symptoms Supplies sent to obtain stool sample for H pylori test Trial H2blocker or PPI/reflux precautions Last Assessment & Plan:  2 month history of symptoms Supplies sent to obtain stool sample for H pylori test Trial H2blocker or PPI/reflux precautions   History of Helicobacter pylori infection    Hyperthyroidism    Varicose veins of leg with pain 04/01/2014   Last Assessment & Plan:  Generalized achiness related to bilateral varicose veins.  Discussed support stockings, extremity elevation, both of which she has done in the past.  She is interested in procedures to alleviate the pain. Last Assessment & Plan:  Generalized achiness related to bilateral varicose veins.  Discussed support stockings, extremity elevation, both of which she has done in the pa   History reviewed. No pertinent surgical history. Patient Active Problem List   Diagnosis Date Noted   Chronic myofascial pain 10/23/2022   Graves disease 06/29/2021   Pain and swelling of right wrist 09/18/2020   Upper back pain 09/18/2020   Neck  pain 07/18/2020   Hyperthyroidism 10/23/2018   Special screening for malignant neoplasms, colon 02/11/2018   History of Helicobacter pylori infection 02/11/2018   Palpitations 01/22/2018   Unsteadiness 12/30/2017   Gastroesophageal reflux disease without esophagitis 09/27/2015   Corn of foot 04/22/2014   Varicose veins of leg with pain 04/01/2014    PCP: Verlon Au, MD   REFERRING PROVIDER: Horton Chin, MD   REFERRING DIAG: [phone] (ICD-10-CM) - Chronic myofascial pain   Rationale for Evaluation and Treatment: Rehabilitation  THERAPY DIAG:  Cervicalgia  Muscle weakness (generalized)  Chronic bilateral low back pain without sciatica  Abnormal posture  ONSET DATE: Two years of chronic myofascial  SUBJECTIVE:  SUBJECTIVE STATEMENT: I am a 9/10 today with really tight upper traps. I am interested in dry needling.  I have been trying to take care of my special needs son and I missed last week because of him. . I have a stationary bike I use. I am not in a routine walking program.  PERTINENT HISTORY:  Anxiety, GERD, Hyperthyroidism, varicose veins, vertigo in past  PAIN:  Are you having pain? Yes: NPRS scale: at rest 3/10  but at worst after exercising 9/10 Pain location: all over body pain but mostly in upper shoulders and neck and low back Pain description: intermittent aching and muscle cramping pain Aggravating factors: Exercise like weight training, or lifting  carrying bags for groceries caring for my special needs son with downs syndrome Relieving factors: Not exercising  PRECAUTIONS: None  WEIGHT BEARING RESTRICTIONS: No  FALLS:  Has patient fallen in last 6 months? No  LIVING ENVIRONMENT: Lives with: lives with their family Lives in:  House/apartment Stairs:  no issues with stairs Has following equipment at home: None  OCCUPATION: SAHM  PLOF: Independent  PATIENT GOALS: Be able to exercise without residual myofascial pain  NEXT MD VISIT: TBD  OBJECTIVE: (All data from evaluation unless otherwise stated.  DIAGNOSTIC FINDINGS:  None recent  PATIENT SURVEYS:  FOTO 32% predicted 49%  SCREENING FOR RED FLAGS: Bowel or bladder incontinence: No   COGNITION: Overall cognitive status: Within functional limits for tasks assessed     SENSATION: WFL  MUSCLE LENGTH: Hamstrings: Right 80 deg; Left 75 deg   POSTURE: rounded shoulders, forward head, and anterior pelvic tilt  PALPATION: Overall tenderness , UT tightness bil  BIL UE  WNL CERVICAL ROM:   Active ROM A/PROM (deg) eval  Flexion 35  Extension 59  Right lateral flexion 27  Left lateral flexion 20  Right rotation 49  Left rotation 44   (Blank rows = not tested)    LUMBAR ROM:   AROM eval  Flexion Fingertips to toes  Extension 60%  Right lateral flexion Fingertips to  1 inch above knee jt  Left lateral flexion Fingertips to  1 inch above knee jt  Right rotation 50%  Left rotation 50%   (Blank rows = not tested)  LOWER EXTREMITY ROM:     Active  Right eval Left eval  Hip flexion 90 90  Hip extension    Hip abduction    Hip adduction    Hip internal rotation    Hip external rotation    Knee flexion    Knee extension    Ankle dorsiflexion    Ankle plantarflexion    Ankle inversion    Ankle eversion     (Blank rows = not tested)  LOWER EXTREMITY MMT:    MMT Right eval Left eval  Hip flexion    Hip extension    Hip abduction    Hip adduction    Hip internal rotation    Hip external rotation 30 35  Knee flexion    Knee extension    Ankle dorsiflexion    Ankle plantarflexion    Ankle inversion    Ankle eversion     (Blank rows = not tested)  LUMBAR SPECIAL TESTS:  Straight leg raise test: Negative and Slump test:  Negative  FUNCTIONAL TESTS:  5 times sit to stand: 9.66  GAIT: Distance walked: 150 Assistive device utilized: None Level of assistance: Complete Independence Comments: No issues with gait   Eval (02-11-23)  Supine single leg bridge:  Not good form      Supine bridge   can only hold for 67 sec (normal 120 sec      Sidelying plank from knees: <39'' on L and on R 41" (norm >30'')        10/10 pain after exercise                            TODAY'S TREATMENT:      [facility] Adult PT Treatment:                                                DATE: 03-04-23 Therapeutic Exercise: Star pattern green t band 3 x 10 in supine Seated Cervical Retraction Protraction AROM 1 sets - 10 reps Seated Gentle Upper Trapezius Stretch   R and L 2 reps - 30 hold Gentle Levator Scapulae Stretch  2  reps - 30 hold Supine Pelvic Tilt  x 10 SKTC 5 x each side Bridge  10x Manual Therapy: Trigger Point Dry Needling Treatment: Pre-treatment instruction: Patient instructed on dry needling rationale, procedures, and possible side effects including pain during treatment (achy,cramping feeling), bruising, drop of blood, lightheadedness, nausea, sweating. Patient Consent Given: Yes Education handout provided: Yes Muscles treated: BIL UT  Needle size and number: .30x5mm x 4 Electrical stimulation performed: No Parameters: N/A Treatment response/outcome: Twitch response elicited and Palpable decrease in muscle tension Post-treatment instructions: Patient instructed to expect possible mild to moderate muscle soreness later today and/or tomorrow. Patient instructed in methods to reduce muscle soreness and to continue prescribed HEP. If patient was dry needled over the lung field, patient was instructed on signs and symptoms of pneumothorax and, however unlikely, to see immediate medical attention should they occur. Patient was also educated on signs and symptoms of infection and to seek medical attention should they  occur. Patient verbalized understanding of these instructions and education.   Modalities: Moist hot pack Self Care: DOMS explanation  and also importance for cardio for myofascial pain and weight lifting for aging                                                                                                                            DATE: 02-11-23  Eval    PATIENT EDUCATION:  Education details: POC Explanation of findings and FOTO report Person educated: Patient Education method: Explanation and Handouts Education comprehension: verbalized understanding and needs further education  HOME EXERCISE PROGRAM: Access Code: [email or web address] URL: [email or web address] Date: 02/11/2023 Prepared by: Garen Lah  Exercises - Supine Deep Neck Flexor Training  - 2-3 x daily - 7 x weekly - 10 reps - 3 hold - Seated Cervical Retraction Protraction AROM  - 2-3 x daily - 7 x weekly - 1 sets - 10 reps -  Seated Gentle Upper Trapezius Stretch  - 1 x daily - 7 x weekly - 1 sets - 3 reps - 30 hold - Gentle Levator Scapulae Stretch  - 1 x daily - 7 x weekly - 3 sets - 3 reps - 30 hold - Supine Pelvic Tilt  - 1 x daily - 7 x weekly - 3 sets - 10 reps - Supine Single Knee to Chest Stretch  - 2 x daily - 7 x weekly - 1 sets - 5 reps - 10 hold - Supine Lower Trunk Rotation  - 2 x daily - 7 x weekly - 1 sets - 5 reps - 20 hold - Supine Bridge  - 1 x daily - 7 x weekly - 2 sets - 10 reps Added 03-04-23 - Standing Shoulder Horizontal Abduction with Resistance  - 1 x daily - 7 x weekly - 3 sets - 10 reps - Standing Shoulder Diagonal Horizontal Abduction 60/120 Degrees with Resistance  - 1 x daily - 7 x weekly - 3 sets - 10 reps  ASSESSMENT:  CLINICAL IMPRESSION: Pt enters clinic with 9/10 pain especially in bil upper traps. Pt consents to TPDN and is closely monitored. Pt with several marked twitch responses and had decreased discomfort with turning neck post Rx Session.  Pt HEP updated and will  continue POC.  EVAL- Patient is a 57 y.o. female who was seen today for physical therapy evaluation and treatment for chronic myofascial pain in neck and low back. Pt has special needs son she cares for and she is not be able to exercise due to chronic myofascial pain.   Pt used to be able to exercise regularly but has not in the past year due to increasing pain up to 10/10 after exercise.  Ms Carolyne Littles would like to be able to return to more active life and manage symptoms of chronic myofascial pain. Pt will benefit from skilled PT to address impairments and educate on HEP and self care for condition.  OBJECTIVE IMPAIRMENTS: decreased ROM, decreased strength, improper body mechanics, postural dysfunction, and pain.   ACTIVITY LIMITATIONS: carrying, lifting, bending, and squatting  PARTICIPATION LIMITATIONS: driving, community activity, and caring for special needs son and exercing for health  PERSONAL FACTORS: Anxiety, GERD, Hyperthyroidism, varicose veins, vertigo in past are also affecting patient's functional outcome.   REHAB POTENTIAL: Good  CLINICAL DECISION MAKING: Evolving/moderate complexity  EVALUATION COMPLEXITY: Moderate   GOALS: Goals reviewed with patient? Yes  SHORT TERM GOALS: Target date: 03-06-23  Pain after exercise will decrease from 10/10 down to 7/10 Baseline: 10/10 and prefers not to exercise due to pain Goal status: INITIAL  2.  Pt will try aquatics to add exercise to routine and develop HEP Baseline: No knowledge of aquatics Goal status: INITIAL  3.  Pt will be independent with [place] initial HEP Baseline: limited knowledge Goal status: INITIAL    LONG TERM GOALS: Target date: 03-28-23  Pt will be independent with advanced HEP and be able to return to gym wt training Baseline: at present haven't returned to gym Goal status: INITIAL  2.  Pt will have 50% decreased tension in cervical and shoulder after exercise Baseline: 10/10 Goal status:  INITIAL  3.  FOTO will improve from  32%  to  49%   indicating improved functional mobility.  Baseline: 32% eval Goal status: INITIAL  4.  Pt will be able to carry 25 lbs of groceries without exacerbating pain greater than 3/10 Baseline: Avoids carrying items due  to stress on neck Goal status: INITIAL  5.  Demonstrate and verbalize techniques to reduce the risk of re-injury including: lifting, posture, body mechanics.  Baseline: limited knoweldge,  pt with forward and flexed posture Goal status: INITIAL  6.  Improved cervical rotation to allow checking blind spots while driving with minimal discomfort Baseline: some limitation with muscle tightness and spams Goal status: INITIAL  PLAN:  PT FREQUENCY: 2x/week  PT DURATION: 6 weeks  PLANNED INTERVENTIONS: Therapeutic exercises, Therapeutic activity, Neuromuscular re-education, Balance training, Gait training, Patient/Family education, Self Care, Joint mobilization, Aquatic Therapy, Dry Needling, Electrical stimulation, Cryotherapy, Moist heat, Taping, Manual therapy, and Re-evaluation.  PLAN FOR NEXT SESSION: Issue HEP for neck basic and basic back.and educate patient     Check all possible CPT codes: 16109 - PT Re-evaluation, 97110- Therapeutic Exercise, [phone]- Neuro Re-education, [phone] - Gait Training, [phone] - Manual Therapy, [phone] - Therapeutic Activities, [phone] - Self Care, and [phone] - Electrical stimulation (Manual)    Check all conditions that are expected to impact treatment: {Conditions expected to impact treatment:Musculoskeletal disorders   If treatment provided at initial evaluation, no treatment charged due to lack of authorization.      Garen Lah, PT, ATRIC Certified Exercise Expert for the Aging Adult  03/04/23 4:14 PM Phone: [phone] Fax: [phone]

## 2023-03-07 ENCOUNTER — Ambulatory Visit: Payer: Medicaid Other | Admitting: Physical Therapy

## 2023-03-07 ENCOUNTER — Encounter: Payer: Self-pay | Admitting: Physical Therapy

## 2023-03-07 DIAGNOSIS — M6281 Muscle weakness (generalized): Secondary | ICD-10-CM

## 2023-03-07 DIAGNOSIS — G8929 Other chronic pain: Secondary | ICD-10-CM

## 2023-03-07 DIAGNOSIS — M542 Cervicalgia: Secondary | ICD-10-CM

## 2023-03-07 DIAGNOSIS — R293 Abnormal posture: Secondary | ICD-10-CM

## 2023-03-07 DIAGNOSIS — M545 Low back pain, unspecified: Secondary | ICD-10-CM

## 2023-03-07 NOTE — Therapy (Signed)
OUTPATIENT PHYSICAL THERAPY THORACOLUMBAR TREATMENT   Patient Name: Melinda Charles MRN: [id number] DOB:1965/11/25, 57 y.o., female Today's Date: 03/07/2023  END OF SESSION:  PT End of Session - 03/07/23 1114     Visit Number 3    Number of Visits 12    Date for PT Re-Evaluation 03/25/23    Authorization Type [employer] No Traction, ESTIM unattended, VAS[place], [place]    Authorization Time Period [employer] [place] -  Approved 9 visits 02/17/23-04/17/23    Authorization - Visit Number 2    Authorization - Number of Visits 9    PT Start Time 1113   pt late   PT Stop Time 1145    PT Time Calculation (min) 32 min              Past Medical History:  Diagnosis Date   Anxiety    [employer] 06/13/2020   Gastroesophageal reflux disease without esophagitis 09/27/2015   Last Assessment & Plan:  2 month history of symptoms Supplies sent to obtain stool sample for H pylori test Trial H2blocker or PPI/reflux precautions Last Assessment & Plan:  2 month history of symptoms Supplies sent to obtain stool sample for H pylori test Trial H2blocker or PPI/reflux precautions   History of Helicobacter pylori infection    Hyperthyroidism    Varicose veins of leg with pain 04/01/2014   Last Assessment & Plan:  Generalized achiness related to bilateral varicose veins.  Discussed support stockings, extremity elevation, both of which she has done in the past.  She is interested in procedures to alleviate the pain. Last Assessment & Plan:  Generalized achiness related to bilateral varicose veins.  Discussed support stockings, extremity elevation, both of which she has done in the pa   History reviewed. No pertinent surgical history. Patient Active Problem List   Diagnosis Date Noted   Chronic myofascial pain 10/23/2022   Graves disease 06/29/2021   Pain and swelling of right wrist 09/18/2020   Upper back pain 09/18/2020   Neck pain 07/18/2020   Hyperthyroidism 10/23/2018   Special screening for  malignant neoplasms, colon 02/11/2018   History of Helicobacter pylori infection 02/11/2018   Palpitations 01/22/2018   Unsteadiness 12/30/2017   Gastroesophageal reflux disease without esophagitis 09/27/2015   Corn of foot 04/22/2014   Varicose veins of leg with pain 04/01/2014    PCP: Verlon Au, MD   REFERRING PROVIDER: Horton Chin, MD   REFERRING DIAG: [phone] (ICD-10-CM) - Chronic myofascial pain   Rationale for Evaluation and Treatment: Rehabilitation  THERAPY DIAG:  Cervicalgia  Muscle weakness (generalized)  Chronic bilateral low back pain without sciatica  Abnormal posture  ONSET DATE: Two years of chronic myofascial  SUBJECTIVE:  SUBJECTIVE STATEMENT: I had so much relief after the last session. It helped a lot. I do not have much back pain. My pain is mostly in the back of my shoulders. 5/10 today.  I went to the gym 2 days ago and did well, except my knee swelled up a little.   PERTINENT HISTORY:  Anxiety, GERD, Hyperthyroidism, varicose veins, vertigo in past  PAIN:  Are you having pain? Yes: NPRS scale: at rest 5/10   Pain location: all over body pain but mostly in upper shoulders and neck and low back Pain description: intermittent aching and muscle cramping pain Aggravating factors: Exercise like weight training, or lifting  carrying bags for groceries caring for my special needs son with downs syndrome Relieving factors: Not exercising  PRECAUTIONS: None  WEIGHT BEARING RESTRICTIONS: No  FALLS:  Has patient fallen in last 6 months? No  LIVING ENVIRONMENT: Lives with: lives with their family Lives in: House/apartment Stairs:  no issues with stairs Has following equipment at home: None  OCCUPATION: SAHM  PLOF: Independent  PATIENT GOALS:  Be able to exercise without residual myofascial pain  NEXT MD VISIT: TBD  OBJECTIVE: (All data from evaluation unless otherwise stated.  DIAGNOSTIC FINDINGS:  None recent  PATIENT SURVEYS:  FOTO 32% predicted 49%  SCREENING FOR RED FLAGS: Bowel or bladder incontinence: No   COGNITION: Overall cognitive status: Within functional limits for tasks assessed     SENSATION: WFL  MUSCLE LENGTH: Hamstrings: Right 80 deg; Left 75 deg   POSTURE: rounded shoulders, forward head, and anterior pelvic tilt  PALPATION: Overall tenderness , UT tightness bil  BIL UE  WNL CERVICAL ROM:   Active ROM A/PROM (deg) eval AROM 03/07/23  Flexion 35   Extension 59   Right lateral flexion 27 34  Left lateral flexion 20 28  Right rotation 49 55  Left rotation 44 55   (Blank rows = not tested)    LUMBAR ROM:   AROM eval  Flexion Fingertips to toes  Extension 60%  Right lateral flexion Fingertips to  1 inch above knee jt  Left lateral flexion Fingertips to  1 inch above knee jt  Right rotation 50%  Left rotation 50%   (Blank rows = not tested)  LOWER EXTREMITY ROM:     Active  Right eval Left eval  Hip flexion 90 90  Hip extension    Hip abduction    Hip adduction    Hip internal rotation    Hip external rotation    Knee flexion    Knee extension    Ankle dorsiflexion    Ankle plantarflexion    Ankle inversion    Ankle eversion     (Blank rows = not tested)  LOWER EXTREMITY MMT:    MMT Right eval Left eval  Hip flexion    Hip extension    Hip abduction    Hip adduction    Hip internal rotation    Hip external rotation 30 35  Knee flexion    Knee extension    Ankle dorsiflexion    Ankle plantarflexion    Ankle inversion    Ankle eversion     (Blank rows = not tested)  LUMBAR SPECIAL TESTS:  Straight leg raise test: Negative and Slump test: Negative  FUNCTIONAL TESTS:  5 times sit to stand: 9.66  GAIT: Distance walked: 150 Assistive device utilized:  None Level of assistance: Complete Independence Comments: No issues with gait   Eval (02-11-23)  Supine single leg bridge:  Not good form      Supine bridge   can only hold for 67 sec (normal 120 sec      Sidelying plank from knees: <39'' on L and on R 41" (norm >30'')        10/10 pain after exercise                            TODAY'S TREATMENT:     [facility] Adult PT Treatment:                                                DATE: 03/07/23 Therapeutic Exercise: DNF lift 5 sec x 10  Supine Star patter x 10  Supine bilat shoulder ER 10 x 2  Bridge x 10  PPT x 10 PPT with march x 10 LTR x10 SKTC x 2 each  Seated upper trap and levator stretches     [facility] Adult PT Treatment:                                                DATE: 03-04-23 Therapeutic Exercise: Star pattern green t band 3 x 10 in supine Seated Cervical Retraction Protraction AROM 1 sets - 10 reps Seated Gentle Upper Trapezius Stretch   R and L 2 reps - 30 hold Gentle Levator Scapulae Stretch  2  reps - 30 hold Supine Pelvic Tilt  x 10 SKTC 5 x each side Bridge  10x Manual Therapy: Trigger Point Dry Needling Treatment: Pre-treatment instruction: Patient instructed on dry needling rationale, procedures, and possible side effects including pain during treatment (achy,cramping feeling), bruising, drop of blood, lightheadedness, nausea, sweating. Patient Consent Given: Yes Education handout provided: Yes Muscles treated: BIL UT  Needle size and number: .30x107mm x 4 Electrical stimulation performed: No Parameters: N/A Treatment response/outcome: Twitch response elicited and Palpable decrease in muscle tension Post-treatment instructions: Patient instructed to expect possible mild to moderate muscle soreness later today and/or tomorrow. Patient instructed in methods to reduce muscle soreness and to continue prescribed HEP. If patient was dry needled over the lung field, patient was instructed on signs and symptoms of  pneumothorax and, however unlikely, to see immediate medical attention should they occur. Patient was also educated on signs and symptoms of infection and to seek medical attention should they occur. Patient verbalized understanding of these instructions and education.   Modalities: Moist hot pack Self Care: DOMS explanation  and also importance for cardio for myofascial pain and weight lifting for aging  DATE: 02-11-23  Eval    PATIENT EDUCATION:  Education details: POC Explanation of findings and FOTO report Person educated: Patient Education method: Explanation and Handouts Education comprehension: verbalized understanding and needs further education  HOME EXERCISE PROGRAM: Access Code: [email or web address] URL: [email or web address] Date: 02/11/2023 Prepared by: Garen Lah  Exercises - Supine Deep Neck Flexor Training  - 2-3 x daily - 7 x weekly - 10 reps - 3 hold - Seated Cervical Retraction Protraction AROM  - 2-3 x daily - 7 x weekly - 1 sets - 10 reps - Seated Gentle Upper Trapezius Stretch  - 1 x daily - 7 x weekly - 1 sets - 3 reps - 30 hold - Gentle Levator Scapulae Stretch  - 1 x daily - 7 x weekly - 3 sets - 3 reps - 30 hold - Supine Pelvic Tilt  - 1 x daily - 7 x weekly - 3 sets - 10 reps - Supine Single Knee to Chest Stretch  - 2 x daily - 7 x weekly - 1 sets - 5 reps - 10 hold - Supine Lower Trunk Rotation  - 2 x daily - 7 x weekly - 1 sets - 5 reps - 20 hold - Supine Bridge  - 1 x daily - 7 x weekly - 2 sets - 10 reps Added 03-04-23 - Standing Shoulder Horizontal Abduction with Resistance  - 1 x daily - 7 x weekly - 3 sets - 10 reps - Standing Shoulder Diagonal Horizontal Abduction 60/120 Degrees with Resistance  - 1 x daily - 7 x weekly - 3 sets - 10 reps  ASSESSMENT:  CLINICAL IMPRESSION: Pt enters clinic with 5/10 pain in posterior  shoulders and upper traps. She reports significant relief since TPDN last session. She also went to the gym two days ago and had a little knee irritation however no significant increase in her general pain. Her cervical AROM has improved. She has decided against aquatic treatment at this time and may decide to cancel her aquatic appts- will decide at next visit. Continued with HEP and progressed Lumbar stabilization and DNF strengthening. Pt tolerated session well without increased pain.  STGs MET.    EVAL- Patient is a 57 y.o. female who was seen today for physical therapy evaluation and treatment for chronic myofascial pain in neck and low back. Pt has special needs son she cares for and she is not be able to exercise due to chronic myofascial pain.   Pt used to be able to exercise regularly but has not in the past year due to increasing pain up to 10/10 after exercise.  Melinda Charles would like to be able to return to more active life and manage symptoms of chronic myofascial pain. Pt will benefit from skilled PT to address impairments and educate on HEP and self care for condition.  OBJECTIVE IMPAIRMENTS: decreased ROM, decreased strength, improper body mechanics, postural dysfunction, and pain.   ACTIVITY LIMITATIONS: carrying, lifting, bending, and squatting  PARTICIPATION LIMITATIONS: driving, community activity, and caring for special needs son and exercing for health  PERSONAL FACTORS: Anxiety, GERD, Hyperthyroidism, varicose veins, vertigo in past are also affecting patient's functional outcome.   REHAB POTENTIAL: Good  CLINICAL DECISION MAKING: Evolving/moderate complexity  EVALUATION COMPLEXITY: Moderate   GOALS: Goals reviewed with patient? Yes  SHORT TERM GOALS: Target date: 03-06-23  Pain after exercise will decrease from 10/10 down to 7/10 Baseline: 10/10 and prefers not to exercise due to pain 03/07/23: went to gym this week  without increased pain  Goal status:  MET  2.  Pt will try aquatics to add exercise to routine and develop HEP Baseline: No knowledge of aquatics Goal status: DEFERRED -pt unable to attend   3.  Pt will be independent with [place] initial HEP Baseline: limited knowledge Goal status: MET    LONG TERM GOALS: Target date: 03-28-23  Pt will be independent with advanced HEP and be able to return to gym wt training Baseline: at present haven't returned to gym Goal status: INITIAL  2.  Pt will have 50% decreased tension in cervical and shoulder after exercise Baseline: 10/10 03/07/23: able to attend gym this week without increased neck.shoulder tension Goal status: ONGOING  3.  FOTO will improve from  32%  to  49%   indicating improved functional mobility.  Baseline: 32% eval Goal status: INITIAL  4.  Pt will be able to carry 25 lbs of groceries without exacerbating pain greater than 3/10 Baseline: Avoids carrying items due to stress on neck Goal status: INITIAL  5.  Demonstrate and verbalize techniques to reduce the risk of re-injury including: lifting, posture, body mechanics.  Baseline: limited knoweldge,  pt with forward and flexed posture Goal status: INITIAL  6.  Improved cervical rotation to allow checking blind spots while driving with minimal discomfort Baseline: some limitation with muscle tightness and spams Goal status: INITIAL  PLAN:  PT FREQUENCY: 2x/week  PT DURATION: 6 weeks  PLANNED INTERVENTIONS: Therapeutic exercises, Therapeutic activity, Neuromuscular re-education, Balance training, Gait training, Patient/Family education, Self Care, Joint mobilization, Aquatic Therapy, Dry Needling, Electrical stimulation, Cryotherapy, Moist heat, Taping, Manual therapy, and Re-evaluation.  PLAN FOR NEXT SESSION: Issue HEP for neck basic and basic back.and educate patient     Check all possible CPT codes: 02725 - PT Re-evaluation, 97110- Therapeutic Exercise, [phone]- Neuro Re-education, [phone] - Gait Training,  [phone] - Manual Therapy, [phone] - Therapeutic Activities, [phone] - Self Care, and [phone] - Electrical stimulation (Manual)    Check all conditions that are expected to impact treatment: {Conditions expected to impact treatment:Musculoskeletal disorders   If treatment provided at initial evaluation, no treatment charged due to lack of authorization.      Jannette Spanner, PTA 03/07/23 12:50 PM Phone: [phone] Fax: [phone]

## 2023-03-10 ENCOUNTER — Ambulatory Visit: Payer: Medicaid Other | Admitting: Physical Therapy

## 2023-03-11 ENCOUNTER — Encounter
Payer: Medicaid Other | Attending: Physical Medicine and Rehabilitation | Admitting: Physical Medicine and Rehabilitation

## 2023-03-11 DIAGNOSIS — M7918 Myalgia, other site: Secondary | ICD-10-CM | POA: Insufficient documentation

## 2023-03-11 DIAGNOSIS — G8929 Other chronic pain: Secondary | ICD-10-CM | POA: Insufficient documentation

## 2023-03-12 ENCOUNTER — Ambulatory Visit: Payer: Medicaid Other | Admitting: Physical Therapy

## 2023-03-17 ENCOUNTER — Encounter: Payer: Self-pay | Admitting: Physical Therapy

## 2023-03-17 ENCOUNTER — Ambulatory Visit: Payer: Medicaid Other | Admitting: Physical Therapy

## 2023-03-17 DIAGNOSIS — G8929 Other chronic pain: Secondary | ICD-10-CM

## 2023-03-17 DIAGNOSIS — M542 Cervicalgia: Secondary | ICD-10-CM

## 2023-03-17 DIAGNOSIS — M6281 Muscle weakness (generalized): Secondary | ICD-10-CM

## 2023-03-17 DIAGNOSIS — R293 Abnormal posture: Secondary | ICD-10-CM | POA: Diagnosis not present

## 2023-03-17 DIAGNOSIS — M545 Low back pain, unspecified: Secondary | ICD-10-CM | POA: Diagnosis not present

## 2023-03-17 NOTE — Therapy (Signed)
OUTPATIENT PHYSICAL THERAPY THORACOLUMBAR TREATMENT   Patient Name: Melinda Charles MRN: [id number] DOB:04/15/1966, 57 y.o., female Today's Date: 03/17/2023  END OF SESSION:  PT End of Session - 03/17/23 1408     Visit Number 4    Number of Visits 12    Date for PT Re-Evaluation 03/25/23    Authorization Type [employer] No Traction, ESTIM unattended, VASO, [place]    Authorization Time Period [employer] [place] -  Approved 9 visits 02/17/23-04/17/23    Authorization - Visit Number 3    Authorization - Number of Visits 9    PT Start Time 0207    PT Stop Time 0258    PT Time Calculation (min) 51 min              Past Medical History:  Diagnosis Date   Anxiety    [employer] 06/13/2020   Gastroesophageal reflux disease without esophagitis 09/27/2015   Last Assessment & Plan:  2 month history of symptoms Supplies sent to obtain stool sample for H pylori test Trial H2blocker or PPI/reflux precautions Last Assessment & Plan:  2 month history of symptoms Supplies sent to obtain stool sample for H pylori test Trial H2blocker or PPI/reflux precautions   History of Helicobacter pylori infection    Hyperthyroidism    Varicose veins of leg with pain 04/01/2014   Last Assessment & Plan:  Generalized achiness related to bilateral varicose veins.  Discussed support stockings, extremity elevation, both of which she has done in the past.  She is interested in procedures to alleviate the pain. Last Assessment & Plan:  Generalized achiness related to bilateral varicose veins.  Discussed support stockings, extremity elevation, both of which she has done in the pa   History reviewed. No pertinent surgical history. Patient Active Problem List   Diagnosis Date Noted   Chronic myofascial pain 10/23/2022   Graves disease 06/29/2021   Pain and swelling of right wrist 09/18/2020   Upper back pain 09/18/2020   Neck pain 07/18/2020   Hyperthyroidism 10/23/2018   Special screening for  malignant neoplasms, colon 02/11/2018   History of Helicobacter pylori infection 02/11/2018   Palpitations 01/22/2018   Unsteadiness 12/30/2017   Gastroesophageal reflux disease without esophagitis 09/27/2015   Corn of foot 04/22/2014   Varicose veins of leg with pain 04/01/2014    PCP: Verlon Au, MD   REFERRING PROVIDER: Horton Chin, MD   REFERRING DIAG: [phone] (ICD-10-CM) - Chronic myofascial pain   Rationale for Evaluation and Treatment: Rehabilitation  THERAPY DIAG:  Cervicalgia  Muscle weakness (generalized)  Chronic bilateral low back pain without sciatica  ONSET DATE: Two years of chronic myofascial  SUBJECTIVE:  SUBJECTIVE STATEMENT: I am getting spasms in back neck and top/back of shoulders throughout the day. I went to gym last week and had neck/shoulder pain afterward for several days , not during the exercise. I have tightness right now 8/10. Low back pain has been minimal.    PERTINENT HISTORY:  Anxiety, GERD, Hyperthyroidism, varicose veins, vertigo in past  PAIN:  Are you having pain? Yes: NPRS scale: at rest 8/10   Pain location: all over body pain but mostly in upper shoulders and neck and low back Pain description: intermittent aching and muscle cramping pain Aggravating factors: Exercise like weight training, or lifting  carrying bags for groceries caring for my special needs son with downs syndrome Relieving factors: Not exercising  PRECAUTIONS: None  WEIGHT BEARING RESTRICTIONS: No  FALLS:  Has patient fallen in last 6 months? No  LIVING ENVIRONMENT: Lives with: lives with their family Lives in: House/apartment Stairs:  no issues with stairs Has following equipment at home: None  OCCUPATION: SAHM  PLOF: Independent  PATIENT GOALS:  Be able to exercise without residual myofascial pain  NEXT MD VISIT: TBD  OBJECTIVE: (All data from evaluation unless otherwise stated.  DIAGNOSTIC FINDINGS:  None recent  PATIENT SURVEYS:  FOTO 32% predicted 49%  SCREENING FOR RED FLAGS: Bowel or bladder incontinence: No   COGNITION: Overall cognitive status: Within functional limits for tasks assessed     SENSATION: WFL  MUSCLE LENGTH: Hamstrings: Right 80 deg; Left 75 deg   POSTURE: rounded shoulders, forward head, and anterior pelvic tilt  PALPATION: Overall tenderness , UT tightness bil  BIL UE  WNL CERVICAL ROM:   Active ROM A/PROM (deg) eval AROM 03/07/23 AROM 03/17/23  Flexion 35    Extension 59    Right lateral flexion 27 34 24  Left lateral flexion 20 28 20   Right rotation 49 55   Left rotation 44 55    (Blank rows = not tested)    LUMBAR ROM:   AROM eval  Flexion Fingertips to toes  Extension 60%  Right lateral flexion Fingertips to  1 inch above knee jt  Left lateral flexion Fingertips to  1 inch above knee jt  Right rotation 50%  Left rotation 50%   (Blank rows = not tested)  LOWER EXTREMITY ROM:     Active  Right eval Left eval  Hip flexion 90 90  Hip extension    Hip abduction    Hip adduction    Hip internal rotation    Hip external rotation    Knee flexion    Knee extension    Ankle dorsiflexion    Ankle plantarflexion    Ankle inversion    Ankle eversion     (Blank rows = not tested)  LOWER EXTREMITY MMT:    MMT Right eval Left eval  Hip flexion    Hip extension    Hip abduction    Hip adduction    Hip internal rotation    Hip external rotation 30 35  Knee flexion    Knee extension    Ankle dorsiflexion    Ankle plantarflexion    Ankle inversion    Ankle eversion     (Blank rows = not tested)  LUMBAR SPECIAL TESTS:  Straight leg raise test: Negative and Slump test: Negative  FUNCTIONAL TESTS:  5 times sit to stand: 9.66  GAIT: Distance walked:  150 Assistive device utilized: None Level of assistance: Complete Independence Comments: No issues with gait   Eval (  02-11-23)      Supine single leg bridge:  Not good form      Supine bridge   can only hold for 67 sec (normal 120 sec      Sidelying plank from knees: <39'' on L and on R 41" (norm >30'')        10/10 pain after exercise                            TODAY'S TREATMENT:     [facility] Adult PT Treatment:                                                DATE: 03/17/23 Therapeutic Exercise: Open books x 5 each  Supine horiz abdct Supine diagonals green band  Supine green band ER  x 10  Manual Therapy:  Trigger Point Dry Needling Treatment: Performed by Letitia Libra, DPT Pre-treatment instruction: Patient instructed on dry needling rationale, procedures, and possible side effects including pain during treatment (achy,cramping feeling), bruising, drop of blood, lightheadedness, nausea, sweating. Patient Consent Given: Yes Education handout provided: Yes Muscles treated: BIL UT  Needle size and number: .30x44mm x 4 Electrical stimulation performed: No Parameters: N/A Treatment response/outcome: Twitch response elicited and Palpable decrease in muscle tension Post-treatment instructions: Patient instructed to expect possible mild to moderate muscle soreness later today and/or tomorrow. Patient instructed in methods to reduce muscle soreness and to continue prescribed HEP. If patient was dry needled over the lung field, patient was instructed on signs and symptoms of pneumothorax and, however unlikely, to see immediate medical attention should they occur. Patient was also educated on signs and symptoms of infection and to seek medical attention should they occur. Patient verbalized understanding of these instructions and education.   STW to bilat upper traps following TPDN, prone Modalities HMP cervical x 10 min      [facility] Adult PT Treatment:                                                 DATE: 03/07/23 Therapeutic Exercise: DNF lift 5 sec x 10  Supine Star patter x 10  Supine bilat shoulder ER 10 x 2  Bridge x 10  PPT x 10 PPT with march x 10 LTR x10 SKTC x 2 each  Seated upper trap and levator stretches     [facility] Adult PT Treatment:                                                DATE: 03-04-23 Therapeutic Exercise: Star pattern green t band 3 x 10 in supine Seated Cervical Retraction Protraction AROM 1 sets - 10 reps Seated Gentle Upper Trapezius Stretch   R and L 2 reps - 30 hold Gentle Levator Scapulae Stretch  2  reps - 30 hold Supine Pelvic Tilt  x 10 SKTC 5 x each side Bridge  10x Manual Therapy: Trigger Point Dry Needling Treatment: Pre-treatment instruction: Patient instructed on dry needling rationale, procedures, and possible side effects including pain during treatment (achy,cramping feeling), bruising,  drop of blood, lightheadedness, nausea, sweating. Patient Consent Given: Yes Education handout provided: Yes Muscles treated: BIL UT  Needle size and number: .30x93mm x 4 Electrical stimulation performed: No Parameters: N/A Treatment response/outcome: Twitch response elicited and Palpable decrease in muscle tension Post-treatment instructions: Patient instructed to expect possible mild to moderate muscle soreness later today and/or tomorrow. Patient instructed in methods to reduce muscle soreness and to continue prescribed HEP. If patient was dry needled over the lung field, patient was instructed on signs and symptoms of pneumothorax and, however unlikely, to see immediate medical attention should they occur. Patient was also educated on signs and symptoms of infection and to seek medical attention should they occur. Patient verbalized understanding of these instructions and education.   Modalities: Moist hot pack Self Care: DOMS explanation  and also importance for cardio for myofascial pain and weight lifting for aging                                                                                                                             DATE: 02-11-23  Eval    PATIENT EDUCATION:  Education details: POC Explanation of findings and FOTO report Person educated: Patient Education method: Explanation and Handouts Education comprehension: verbalized understanding and needs further education  HOME EXERCISE PROGRAM: Access Code: [email or web address] URL: [email or web address] Date: 02/11/2023 Prepared by: Garen Lah  Exercises - Supine Deep Neck Flexor Training  - 2-3 x daily - 7 x weekly - 10 reps - 3 hold - Seated Cervical Retraction Protraction AROM  - 2-3 x daily - 7 x weekly - 1 sets - 10 reps - Seated Gentle Upper Trapezius Stretch  - 1 x daily - 7 x weekly - 1 sets - 3 reps - 30 hold - Gentle Levator Scapulae Stretch  - 1 x daily - 7 x weekly - 3 sets - 3 reps - 30 hold - Supine Pelvic Tilt  - 1 x daily - 7 x weekly - 3 sets - 10 reps - Supine Single Knee to Chest Stretch  - 2 x daily - 7 x weekly - 1 sets - 5 reps - 10 hold - Supine Lower Trunk Rotation  - 2 x daily - 7 x weekly - 1 sets - 5 reps - 20 hold - Supine Bridge  - 1 x daily - 7 x weekly - 2 sets - 10 reps Added 03-04-23 - Standing (perform Supine) Shoulder Horizontal Abduction with Resistance  - 1 x daily - 7 x weekly - 3 sets - 10 reps - Standing( perform supine)  Shoulder Diagonal Horizontal Abduction 60/120 Degrees with Resistance  - 1 x daily - 7 x weekly - 3 sets - 10 reps  ASSESSMENT:  CLINICAL IMPRESSION: Pt reports she went to the gym again last week and completed UE machines and had increased pain for several days. She reports compliance with HEP. Reviewed band HEP which she has been performing in standing which may be contributing  to her upper trap tightness. Reviewed HEP bands in supine which is how they were initially presecribed. She reported decreased tightness in this position and was able to control shoulder hike. Sam [place], D PT was  available to perform TPDN to bilat upper traps. Pt felt soreness after TPDN and was treated with STW and HMP. She had some dizziness upon sitting up after treatment and was better after rest.     EVAL- Patient is a 57 y.o. female who was seen today for physical therapy evaluation and treatment for chronic myofascial pain in neck and low back. Pt has special needs son she cares for and she is not be able to exercise due to chronic myofascial pain.   Pt used to be able to exercise regularly but has not in the past year due to increasing pain up to 10/10 after exercise.  Ms Carolyne Littles would like to be able to return to more active life and manage symptoms of chronic myofascial pain. Pt will benefit from skilled PT to address impairments and educate on HEP and self care for condition.  OBJECTIVE IMPAIRMENTS: decreased ROM, decreased strength, improper body mechanics, postural dysfunction, and pain.   ACTIVITY LIMITATIONS: carrying, lifting, bending, and squatting  PARTICIPATION LIMITATIONS: driving, community activity, and caring for special needs son and exercing for health  PERSONAL FACTORS: Anxiety, GERD, Hyperthyroidism, varicose veins, vertigo in past are also affecting patient's functional outcome.   REHAB POTENTIAL: Good  CLINICAL DECISION MAKING: Evolving/moderate complexity  EVALUATION COMPLEXITY: Moderate   GOALS: Goals reviewed with patient? Yes  SHORT TERM GOALS: Target date: 03-06-23  Pain after exercise will decrease from 10/10 down to 7/10 Baseline: 10/10 and prefers not to exercise due to pain 03/07/23: went to gym this week without increased pain  Goal status: MET  2.  Pt will try aquatics to add exercise to routine and develop HEP Baseline: No knowledge of aquatics Goal status: DEFERRED -pt unable to attend   3.  Pt will be independent with [place] initial HEP Baseline: limited knowledge Goal status: MET    LONG TERM GOALS: Target date: 03-28-23  Pt will be  independent with advanced HEP and be able to return to gym wt training Baseline: at present haven't returned to gym Goal status: INITIAL  2.  Pt will have 50% decreased tension in cervical and shoulder after exercise Baseline: 10/10 03/07/23: able to attend gym this week without increased neck.shoulder tension Goal status: ONGOING  3.  FOTO will improve from  32%  to  49%   indicating improved functional mobility.  Baseline: 32% eval Goal status: INITIAL  4.  Pt will be able to carry 25 lbs of groceries without exacerbating pain greater than 3/10 Baseline: Avoids carrying items due to stress on neck Goal status: INITIAL  5.  Demonstrate and verbalize techniques to reduce the risk of re-injury including: lifting, posture, body mechanics.  Baseline: limited knoweldge,  pt with forward and flexed posture Goal status: INITIAL  6.  Improved cervical rotation to allow checking blind spots while driving with minimal discomfort Baseline: some limitation with muscle tightness and spams Goal status: INITIAL  PLAN:  PT FREQUENCY: 2x/week  PT DURATION: 6 weeks  PLANNED INTERVENTIONS: Therapeutic exercises, Therapeutic activity, Neuromuscular re-education, Balance training, Gait training, Patient/Family education, Self Care, Joint mobilization, Aquatic Therapy, Dry Needling, Electrical stimulation, Cryotherapy, Moist heat, Taping, Manual therapy, and Re-evaluation.  PLAN FOR NEXT SESSION: Issue HEP for neck basic and basic back.and educate patient  Check all possible CPT codes: 60454 - PT Re-evaluation, 97110- Therapeutic Exercise, [phone]- Neuro Re-education, [phone] - Gait Training, [phone] - Manual Therapy, [phone] - Therapeutic Activities, [phone] - Self Care, and [phone] - Electrical stimulation (Manual)    Check all conditions that are expected to impact treatment: {Conditions expected to impact treatment:Musculoskeletal disorders   If treatment provided at initial evaluation, no treatment  charged due to lack of authorization.      Jannette Spanner, PTA 03/17/23 3:02 PM Phone: [phone] Fax: [phone]

## 2023-03-26 ENCOUNTER — Ambulatory Visit: Payer: Medicaid Other | Admitting: Physical Therapy

## 2023-04-01 ENCOUNTER — Ambulatory Visit: Payer: Medicaid Other | Attending: Physical Medicine and Rehabilitation | Admitting: Physical Therapy

## 2023-04-01 DIAGNOSIS — G8929 Other chronic pain: Secondary | ICD-10-CM | POA: Diagnosis not present

## 2023-04-01 DIAGNOSIS — M545 Low back pain, unspecified: Secondary | ICD-10-CM | POA: Diagnosis not present

## 2023-04-01 DIAGNOSIS — M6281 Muscle weakness (generalized): Secondary | ICD-10-CM | POA: Insufficient documentation

## 2023-04-01 DIAGNOSIS — R293 Abnormal posture: Secondary | ICD-10-CM | POA: Diagnosis not present

## 2023-04-01 DIAGNOSIS — M542 Cervicalgia: Secondary | ICD-10-CM | POA: Insufficient documentation

## 2023-04-01 NOTE — Therapy (Addendum)
OUTPATIENT PHYSICAL THERAPY THORACOLUMBAR TREATMENT/ERO/DISCHARGE NOTE PHYSICAL THERAPY DISCHARGE SUMMARY  Visits from Start of Care: 5  Current functional level related to goals / functional outcomes: unknown   Remaining deficits: unknown   Education / Equipment: Initial HEP   Patient agrees to discharge. Patient goals were partially met. Patient is being discharged due to not returning since the last visit.   Garen Lah, PT, ATRIC Certified Exercise Expert for the Aging Adult  06/03/23 10:15 AM Phone: [phone] Fax: [phone]   Patient Name: Melinda Charles MRN: [id number] DOB:21-Nov-1965, 57 y.o., female Today's Date: 04/01/2023  END OF SESSION:  PT End of Session - 04/01/23 1342     Visit Number 5    Number of Visits 12    Date for PT Re-Evaluation 05/27/23    Authorization Type [employer] No Traction, ESTIM unattended, VASO, [place]    Authorization Time Period [employer] [place] -  Approved 9 visits 02/17/23-04/17/23    Authorization - Visit Number 4    Authorization - Number of Visits 9    PT Start Time 1335    PT Stop Time 1425    PT Time Calculation (min) 50 min    Activity Tolerance Patient tolerated treatment well;Patient limited by pain    Behavior During Therapy [facility] for tasks assessed/performed               Past Medical History:  Diagnosis Date   Anxiety    [employer] 06/13/2020   Gastroesophageal reflux disease without esophagitis 09/27/2015   Last Assessment & Plan:  2 month history of symptoms Supplies sent to obtain stool sample for H pylori test Trial H2blocker or PPI/reflux precautions Last Assessment & Plan:  2 month history of symptoms Supplies sent to obtain stool sample for H pylori test Trial H2blocker or PPI/reflux precautions   History of Helicobacter pylori infection    Hyperthyroidism    Varicose veins of leg with pain 04/01/2014   Last Assessment & Plan:  Generalized achiness related to bilateral varicose veins.   Discussed support stockings, extremity elevation, both of which she has done in the past.  She is interested in procedures to alleviate the pain. Last Assessment & Plan:  Generalized achiness related to bilateral varicose veins.  Discussed support stockings, extremity elevation, both of which she has done in the pa   No past surgical history on file. Patient Active Problem List   Diagnosis Date Noted   Chronic myofascial pain 10/23/2022   Graves disease 06/29/2021   Pain and swelling of right wrist 09/18/2020   Upper back pain 09/18/2020   Neck pain 07/18/2020   Hyperthyroidism 10/23/2018   Special screening for malignant neoplasms, colon 02/11/2018   History of Helicobacter pylori infection 02/11/2018   Palpitations 01/22/2018   Unsteadiness 12/30/2017   Gastroesophageal reflux disease without esophagitis 09/27/2015   Corn of foot 04/22/2014   Varicose veins of leg with pain 04/01/2014    PCP: Verlon Au, MD   REFERRING PROVIDER: Horton Chin, MD   REFERRING DIAG: [phone] (ICD-10-CM) - Chronic myofascial pain   Rationale for Evaluation and Treatment: Rehabilitation  THERAPY DIAG:  Cervicalgia - Plan: PT plan of care cert/re-cert  Muscle weakness (generalized) - Plan: PT plan of care cert/re-cert  Chronic bilateral low back pain without sciatica - Plan: PT plan of care cert/re-cert  Abnormal posture - Plan: PT plan of care cert/re-cert  ONSET DATE: Two years of chronic myofascial  SUBJECTIVE:  SUBJECTIVE STATEMENT: I have been to the gym and I do the UBE, lat pulls, treadmill and I do the rows. I still feel tight.  I still am getting spasms in back neck and top/back of shoulders throughout the day. It seems that it is more intense.  It may be stress. 8/10 pain in upper  back and shoulders   PERTINENT HISTORY:  Anxiety, GERD, Hyperthyroidism, varicose veins, vertigo in past  PAIN:  Are you having pain? Yes: NPRS scale: at rest 8/10   Pain location: all over body pain but mostly in upper shoulders and neck and low back Pain description: intermittent aching and muscle cramping pain Aggravating factors: Exercise like weight training, or lifting  carrying bags for groceries caring for my special needs son with downs syndrome Relieving factors: Not exercising  PRECAUTIONS: None  WEIGHT BEARING RESTRICTIONS: No  FALLS:  Has patient fallen in last 6 months? No  LIVING ENVIRONMENT: Lives with: lives with their family Lives in: House/apartment Stairs:  no issues with stairs Has following equipment at home: None  OCCUPATION: SAHM  PLOF: Independent  PATIENT GOALS: Be able to exercise without residual myofascial pain  NEXT MD VISIT: TBD  OBJECTIVE: (All data from evaluation unless otherwise stated.  DIAGNOSTIC FINDINGS:  None recent  PATIENT SURVEYS:  FOTO 32% predicted 49% 04-01-23  66%  SCREENING FOR RED FLAGS: Bowel or bladder incontinence: No   COGNITION: Overall cognitive status: Within functional limits for tasks assessed     SENSATION: WFL  MUSCLE LENGTH: Hamstrings: Right 80 deg; Left 75 deg   POSTURE: rounded shoulders, forward head, and anterior pelvic tilt  PALPATION: Overall tenderness , UT tightness bil  BIL UE  WNL CERVICAL ROM:   Active ROM A/PROM (deg) eval AROM 03/07/23 AROM 03/17/23 AROM 04-01-23  Flexion 35     Extension 59     Right lateral flexion 27 34 24 24  Left lateral flexion 20 28 20 18   Right rotation 49 55  60  Left rotation 44 55  55   (Blank rows = not tested)    LUMBAR ROM:   AROM eval 04-01-23  Flexion Fingertips to toes Fingertips to floor   Extension 60% 75%  Right lateral flexion Fingertips to  1 inch above knee jt Fingertip to knee jt line  Left lateral flexion Fingertips to  1 inch  above knee jt Fingertips to 1 inch below  Right rotation 50% 75%  Left rotation 50% 75%   (Blank rows = not tested)  LOWER EXTREMITY ROM:     Active  Right eval Left eval  Hip flexion 90 90  Hip extension    Hip abduction    Hip adduction    Hip internal rotation    Hip external rotation    Knee flexion    Knee extension    Ankle dorsiflexion    Ankle plantarflexion    Ankle inversion    Ankle eversion     (Blank rows = not tested)  LOWER EXTREMITY MMT:    MMT Right eval Left eval  Hip flexion    Hip extension    Hip abduction    Hip adduction    Hip internal rotation    Hip external rotation 30 35  Knee flexion    Knee extension    Ankle dorsiflexion    Ankle plantarflexion    Ankle inversion    Ankle eversion     (Blank rows = not tested)  LUMBAR SPECIAL TESTS:  Straight leg raise test: Negative and Slump test: Negative  FUNCTIONAL TESTS:  5 times sit to stand: 9.66  GAIT: Distance walked: 150 Assistive device utilized: None Level of assistance: Complete Independence Comments: No issues with gait   Eval (02-11-23)  03-31-24    Supine single leg bridge:  Not good form  R 27 sec with good form  L  with good form but collapses 20 sec    Supine bridge   can only hold for 67 sec (normal 120 sec 105 sec supine bridge     Sidelying plank from knees: <39'' on L and on R 41" (norm >30'')   R 45 sec L  45 sec     10/10 pain after exercise 8/10                           TODAY'S TREATMENT:  [facility] Adult PT Treatment:                                                DATE: 04-01-23 ERO today Therapeutic Exercise: 10# OH press 3 x 8  See table above for side plank, single limb plank, bridge etc Manual Therapy: STW to bil UT/LS Use of towel for self increase of UT stretch  Inhibition tape for UT bil Trigger Point Dry Needling Treatment: Performed by Letitia Libra, DPT Pre-treatment instruction: Patient instructed on dry needling rationale, procedures, and  possible side effects including pain during treatment (achy,cramping feeling), bruising, drop of blood, lightheadedness, nausea, sweating. Patient Consent Given: Yes Education handout provided: Yes Muscles treated: BIL UT / LS Needle size and number: .30x56mm x 4 Electrical stimulation performed: No Parameters: N/A Treatment response/outcome: Twitch response elicited and Palpable decrease in muscle tension Post-treatment instructions: Patient instructed to expect possible mild to moderate muscle soreness later today and/or tomorrow. Patient instructed in methods to reduce muscle soreness and to continue prescribed HEP. If patient was dry needled over the lung field, patient was instructed on signs and symptoms of pneumothorax and, however unlikely, to see immediate medical attention should they occur. Patient was also educated on signs and symptoms of infection and to seek medical attention should they occur. Patient verbalized understanding of these instructions and education.  Self Care- Answered several questions about Myofascial release, cardio benefits , aquatic benefits, exercise benefits.  Several questions regarding gym equipment  [facility] Adult PT Treatment:                                                DATE: 03/17/23 Therapeutic Exercise: Open books x 5 each  Supine horiz abdct Supine diagonals green band  Supine green band ER  x 10  Manual Therapy:  Trigger Point Dry Needling Treatment: Performed by Letitia Libra, DPT Pre-treatment instruction: Patient instructed on dry needling rationale, procedures, and possible side effects including pain during treatment (achy,cramping feeling), bruising, drop of blood, lightheadedness, nausea, sweating. Patient Consent Given: Yes Education handout provided: Yes Muscles treated: BIL UT  Needle size and number: .30x30mm x 4 Electrical stimulation performed: No Parameters: N/A Treatment response/outcome: Twitch response elicited and Palpable  decrease in muscle tension Post-treatment instructions: Patient instructed to expect possible mild to moderate muscle soreness  later today and/or tomorrow. Patient instructed in methods to reduce muscle soreness and to continue prescribed HEP. If patient was dry needled over the lung field, patient was instructed on signs and symptoms of pneumothorax and, however unlikely, to see immediate medical attention should they occur. Patient was also educated on signs and symptoms of infection and to seek medical attention should they occur. Patient verbalized understanding of these instructions and education.   STW to bilat upper traps following TPDN, prone Modalities HMP cervical x 10 min      [facility] Adult PT Treatment:                                                DATE: 03/07/23 Therapeutic Exercise: DNF lift 5 sec x 10  Supine Star patter x 10  Supine bilat shoulder ER 10 x 2  Bridge x 10  PPT x 10 PPT with march x 10 LTR x10 SKTC x 2 each  Seated upper trap and levator stretches     [facility] Adult PT Treatment:                                                DATE: 03-04-23 Therapeutic Exercise: Star pattern green t band 3 x 10 in supine Seated Cervical Retraction Protraction AROM 1 sets - 10 reps Seated Gentle Upper Trapezius Stretch   R and L 2 reps - 30 hold Gentle Levator Scapulae Stretch  2  reps - 30 hold Supine Pelvic Tilt  x 10 SKTC 5 x each side Bridge  10x Manual Therapy: Trigger Point Dry Needling Treatment: Pre-treatment instruction: Patient instructed on dry needling rationale, procedures, and possible side effects including pain during treatment (achy,cramping feeling), bruising, drop of blood, lightheadedness, nausea, sweating. Patient Consent Given: Yes Education handout provided: Yes Muscles treated: BIL UT  Needle size and number: .30x81mm x 4 Electrical stimulation performed: No Parameters: N/A Treatment response/outcome: Twitch response elicited and Palpable decrease  in muscle tension Post-treatment instructions: Patient instructed to expect possible mild to moderate muscle soreness later today and/or tomorrow. Patient instructed in methods to reduce muscle soreness and to continue prescribed HEP. If patient was dry needled over the lung field, patient was instructed on signs and symptoms of pneumothorax and, however unlikely, to see immediate medical attention should they occur. Patient was also educated on signs and symptoms of infection and to seek medical attention should they occur. Patient verbalized understanding of these instructions and education.   Modalities: Moist hot pack Self Care: DOMS explanation  and also importance for cardio for myofascial pain and weight lifting for aging  DATE: 02-11-23  Eval    PATIENT EDUCATION:  Education details: POC Explanation of findings and FOTO report Person educated: Patient Education method: Explanation and Handouts Education comprehension: verbalized understanding and needs further education  HOME EXERCISE PROGRAM: Access Code: [email or web address] URL: [email or web address] Date: 02/11/2023 Prepared by: Garen Lah  Exercises - Supine Deep Neck Flexor Training  - 2-3 x daily - 7 x weekly - 10 reps - 3 hold - Seated Cervical Retraction Protraction AROM  - 2-3 x daily - 7 x weekly - 1 sets - 10 reps - Seated Gentle Upper Trapezius Stretch  - 1 x daily - 7 x weekly - 1 sets - 3 reps - 30 hold - Gentle Levator Scapulae Stretch  - 1 x daily - 7 x weekly - 3 sets - 3 reps - 30 hold - Supine Pelvic Tilt  - 1 x daily - 7 x weekly - 3 sets - 10 reps - Supine Single Knee to Chest Stretch  - 2 x daily - 7 x weekly - 1 sets - 5 reps - 10 hold - Supine Lower Trunk Rotation  - 2 x daily - 7 x weekly - 1 sets - 5 reps - 20 hold - Supine Bridge  - 1 x daily - 7 x weekly - 2 sets - 10  reps Added 03-04-23 - Standing (perform Supine) Shoulder Horizontal Abduction with Resistance  - 1 x daily - 7 x weekly - 3 sets - 10 reps - Standing( perform supine)  Shoulder Diagonal Horizontal Abduction 60/120 Degrees with Resistance  - 1 x daily - 7 x weekly - 3 sets - 10 reps  ASSESSMENT:  CLINICAL IMPRESSION: Quiera  reports she went to the gym this week and completed UE machines and had increased pain for several days. She reports compliance with HEP. Pt desires to work out and increase weights in gym.  Pt needs more time in order to build strength to safely continue at gym.  Pt today had ERO and has several goals achieved but Pain and high level continues to pose a problem and limiting her continuing ability to perform progressive exercises. Pt modified band exercises to supine with some decrease in pain. Pt also consented to TPDN and had several twitch responses and decreased tightness.   Pt felt soreness after TPDN and was treated with STW  and inhibition tape. Goals revised and achieved as indicated below. PT lateral Neck AROM still limited but cervical rotation is improved.Nyella would benefit from 1 x a week for 8 weeks for time to have increase in muscle grade strength.     EVAL- Patient is a 57 y.o. female who was seen today for physical therapy evaluation and treatment for chronic myofascial pain in neck and low back. Pt has special needs son she cares for and she is not be able to exercise due to chronic myofascial pain.   Pt used to be able to exercise regularly but has not in the past year due to increasing pain up to 10/10 after exercise.  Ms Carolyne Littles would like to be able to return to more active life and manage symptoms of chronic myofascial pain. Pt will benefit from skilled PT to address impairments and educate on HEP and self care for condition.  OBJECTIVE IMPAIRMENTS: decreased ROM, decreased strength, improper body mechanics, postural dysfunction, and pain.   ACTIVITY  LIMITATIONS: carrying, lifting, bending, and squatting  PARTICIPATION LIMITATIONS: driving, community activity, and caring for special needs son and exercing for health  PERSONAL FACTORS: Anxiety, GERD, Hyperthyroidism, varicose veins, vertigo in past are also affecting patient's functional outcome.   REHAB POTENTIAL: Good  CLINICAL DECISION MAKING: Evolving/moderate complexity  EVALUATION COMPLEXITY: Moderate   GOALS: Goals reviewed with patient? Yes  SHORT TERM GOALS: Target date: 03-06-23  Pain after exercise will decrease from 10/10 down to 7/10 Baseline: 10/10 and prefers not to exercise due to pain 03/07/23: went to gym this week without increased pain  Goal status: MET  2.  Pt will try aquatics to add exercise to routine and develop HEP Baseline: No knowledge of aquatics Goal status: DEFERRED -pt unable to attend   3.  Pt will be independent with [place] initial HEP Baseline: limited knowledge Goal status: MET    LONG TERM GOALS: Target date: 03-28-23 revised for 05-27-23  Pt will be independent with advanced HEP and be able to return to gym wt training Baseline: at present haven't returned to gym 04-01-23  able to return to gym but does not have HEP advanced down yet.  Goal status: ONGOING  2.  Pt will have 50% decreased tension in cervical and shoulder after exercise and be able to manage flare ups when increased Baseline: 10/10 03/07/23: able to attend gym this week without increased neck.shoulder tension 04-01-23 8/10 Goal status: ONGOING  3.  FOTO will improve from  32%  to  49%   indicating improved functional mobility.  Baseline: 32% eval 04-01-23 66% Goal status: MET  4.  Pt will be able to carry 25 lbs of groceries without exacerbating pain greater than 3/10 Baseline: Avoids carrying items due to stress on neck STatus 04-01-23 able to carry 10 lb weight in each hand OH press x 8 Goal status: ONGOING  5.  Demonstrate and verbalize techniques to reduce the risk of  re-injury including: lifting, posture, body  and mechanics and demonstrate proper deadlift form Baseline: limited knoweldge,  pt with forward and flexed posture Goal status: Revised  6.  Improved cervical rotation to allow checking blind spots while driving with minimal discomfort Baseline: some limitation with muscle tightness and spams 04-01-23  improved See chart but with spasms minimal discomfort Goal status: MET  PLAN:  PT FREQUENCY: 2x/week  revised 1 x  a week  PT DURATION: 6 weeks  revised  for 8 weeks  PLANNED INTERVENTIONS: Therapeutic exercises, Therapeutic activity, Neuromuscular re-education, Balance training, Gait training, Patient/Family education, Self Care, Joint mobilization, Aquatic Therapy, Dry Needling, Electrical stimulation, Cryotherapy, Moist heat, Taping, Manual therapy, and Re-evaluation.  PLAN FOR NEXT SESSION: Issue HEP for neck basic and basic back.and educate patient      Check all possible CPT codes: 82956 - PT Re-evaluation, 97110- Therapeutic Exercise, [phone]- Neuro Re-education, [phone] - Gait Training, [phone] - Manual Therapy, [phone] - Therapeutic Activities, [phone] - Self Care, and [phone] - Electrical stimulation (Manual)    Check all conditions that are expected to impact treatment: {Conditions expected to impact treatment:Musculoskeletal disorders   If treatment provided at initial evaluation, no treatment charged due to lack of authorization.      Garen Lah, PT, ATRIC Certified Exercise Expert for the Aging Adult  04/01/23 2:48 PM Phone: [phone] Fax: [phone]   Garen Lah, PT, ATRIC Certified Exercise Expert for the Aging Adult  04/01/23 2:49 PM Phone: [phone] Fax: [phone]

## 2023-04-03 ENCOUNTER — Ambulatory Visit: Payer: Medicaid Other | Admitting: Physical Therapy

## 2023-04-08 NOTE — Progress Notes (Deleted)
Office Visit Note  Patient: Melinda Charles             Date of Birth: 12-06-1965           MRN: [id number]             PCP: Verlon Au, MD Referring: Verlon Au, MD Visit Date: 04/09/2023   Subjective:  No chief complaint on file.   History of Present Illness: Melinda Charles is a 57 y.o. female here for follow up after a long interval both mostly the same ongoing problem of pain and stiffness with very tight muscles throughout her upper back shoulders and neck.    Previous HPI 10/23/2022 Melinda Charles is a 57 y.o. female here for follow up after a long interval both mostly the same ongoing problem of pain and stiffness with very tight muscles throughout her upper back shoulders and neck.  She does not recall whether there was ever a large difference with the trial of muscle relaxer medication.  She takes ibuprofen intermittently which is partially helpful.  She has been trying to resume regular physical exercise and attending the gym but having a lot of trouble.  She gets slight relief once up and moving but frequently has exacerbations taking a day or longer to recover if she overexerts herself.  Sometimes with muscle spasms but often just stiff and achiness without any specific trigger.  Does not have much of this affecting lower back and extremities.   Previous HPI 05/07/21 Melinda Charles is a 57 y.o. female here for follow up with some ongoing problems with pain mostly in the neck and upper back.  She continues feeling sometimes lumps or nodules in the affected areas.  She also has headaches and fatigue these are doing somewhat worse as well as the pain and stiffness.  Her symptoms are very distracting sometimes disruptive for sleep and rest due to pain or discomfort.  She had MRI of the cervical spine checked with some mild abnormalities recommendation for orthopedic surgery follow-up to evaluate this.   Previous HPI 09/18/20 Melinda Charles is a 57 y.o. female with a history of hyperthyroidism, anxiety, palpitations here for evaluation of arthralgias especially cervicalgia and upper back muscle pain. These symptoms are ongoing for the past year without any specific infection, medication change, or injuries prior to onset. She has noticed multiple knots or lumps over her back that are tender and have improved sometimes with stretching but often case more pain worse than it started when she tries to exercise. She also has persistent tenderness on the neck. She does not notice much problem in the lower extremities.   She also sometimes experiences dizziness sensation and was evaluated by a specialist for vertigo with no problems found in the ear and vestibular system. She denies significant sleep disorder or bad fatigue except when having pain. She has chronic constipation without any history of complications. She does not have problems with headaches.   She also reports right wrist swelling and pain since a few months ago she thinks it was injured in some fashion but not a typical forward fall. It partially improved but has remained swollen and painful with movement for months.     Labs reviewed 07/2020 ESR 12 CRP <5 RA neg   No Rheumatology ROS completed.   PMFS History:  Patient Active Problem List   Diagnosis Date Noted   Chronic myofascial pain 10/23/2022   Graves disease 06/29/2021   Pain and swelling of right  wrist 09/18/2020   Upper back pain 09/18/2020   Neck pain 07/18/2020   Hyperthyroidism 10/23/2018   Special screening for malignant neoplasms, colon 02/11/2018   History of Helicobacter pylori infection 02/11/2018   Palpitations 01/22/2018   Unsteadiness 12/30/2017   Gastroesophageal reflux disease without esophagitis 09/27/2015   Corn of foot 04/22/2014   Varicose veins of leg with pain 04/01/2014    Past Medical History:  Diagnosis Date   Anxiety    [employer] 06/13/2020   Gastroesophageal  reflux disease without esophagitis 09/27/2015   Last Assessment & Plan:  2 month history of symptoms Supplies sent to obtain stool sample for H pylori test Trial H2blocker or PPI/reflux precautions Last Assessment & Plan:  2 month history of symptoms Supplies sent to obtain stool sample for H pylori test Trial H2blocker or PPI/reflux precautions   History of Helicobacter pylori infection    Hyperthyroidism    Varicose veins of leg with pain 04/01/2014   Last Assessment & Plan:  Generalized achiness related to bilateral varicose veins.  Discussed support stockings, extremity elevation, both of which she has done in the past.  She is interested in procedures to alleviate the pain. Last Assessment & Plan:  Generalized achiness related to bilateral varicose veins.  Discussed support stockings, extremity elevation, both of which she has done in the pa    Family History  Problem Relation Age of Onset   Hypertension Mother    Emphysema Father    Hypertension Father    Hypertension Maternal Grandmother    Diabetes Maternal Grandfather    Hypertension Paternal Grandmother    Heart disease Paternal Grandfather    GER disease Maternal Uncle    No past surgical history on file. Social History   Social History Narrative   Not on file    There is no immunization history on file for this patient.   Objective: Vital Signs: There were no vitals taken for this visit.   Physical Exam   Musculoskeletal Exam: ***  CDAI Exam: CDAI Score: -- Patient Global: --; Provider Global: -- Swollen: --; Tender: -- Joint Exam 04/09/2023   No joint exam has been documented for this visit   There is currently no information documented on the homunculus. Go to the Rheumatology activity and complete the homunculus joint exam.  Investigation: No additional findings.  Imaging: No results found.  Recent Labs: Lab Results  Component Value Date   WBC 6.2 11/24/2019   HGB 12.7 11/24/2019   PLT 267 11/24/2019    NA 141 12/28/2019   K 4.3 12/28/2019   CL 102 12/28/2019   CO2 25 12/28/2019   GLUCOSE 108 (H) 11/24/2019   BUN 13 11/24/2019   CREATININE 0.63 11/24/2019   BILITOT 0.2 11/24/2019   ALKPHOS 72 11/24/2019   AST 18 11/24/2019   ALT 10 11/24/2019   PROT 7.2 11/24/2019   ALBUMIN 4.7 11/24/2019   CALCIUM 9.5 11/24/2019   GFRAA 119 11/24/2019    Speciality Comments: No specialty comments available.  Procedures:  No procedures performed Allergies: Patient has no known allergies.   Assessment / Plan:     Visit Diagnoses: No diagnosis found.  ***  Orders: No orders of the defined types were placed in this encounter.  No orders of the defined types were placed in this encounter.    Follow-Up Instructions: No follow-ups on file.   Metta Clines, RT  Note - This record has been created using [employer].  Chart creation errors have been  sought, but may not always  have been located. Such creation errors do not reflect on  the standard of medical care.

## 2023-04-09 ENCOUNTER — Ambulatory Visit: Payer: Medicaid Other | Admitting: Internal Medicine

## 2023-04-09 DIAGNOSIS — G8929 Other chronic pain: Secondary | ICD-10-CM

## 2023-04-10 ENCOUNTER — Encounter: Payer: Self-pay | Admitting: Physical Medicine and Rehabilitation

## 2023-04-10 ENCOUNTER — Encounter
Payer: Medicaid Other | Attending: Physical Medicine and Rehabilitation | Admitting: Physical Medicine and Rehabilitation

## 2023-04-10 VITALS — BP 117/77 | HR 63 | Ht 64.0 in | Wt 177.4 lb

## 2023-04-10 DIAGNOSIS — G8929 Other chronic pain: Secondary | ICD-10-CM

## 2023-04-10 DIAGNOSIS — M7918 Myalgia, other site: Secondary | ICD-10-CM | POA: Diagnosis not present

## 2023-04-10 MED ORDER — LIDOCAINE HCL 1 % IJ SOLN
3.0000 mL | Freq: Once | INTRAMUSCULAR | Status: AC
  Administered 2023-04-10: 3 mL

## 2023-04-10 MED ORDER — SODIUM CHLORIDE (PF) 0.9 % IJ SOLN
2.0000 mL | Freq: Once | INTRAMUSCULAR | Status: AC
Start: 2023-04-10 — End: 2023-04-10
  Administered 2023-04-10: 2 mL

## 2023-04-10 NOTE — Patient Instructions (Signed)
Avoid seed oils Massage Red light therapy Urolithin A, pomegranate Magnesium supplement glycinate 250mg   Exercises Tart cherry juice

## 2023-04-10 NOTE — Progress Notes (Signed)

## 2023-04-22 ENCOUNTER — Ambulatory Visit: Payer: Medicaid Other | Admitting: Physical Therapy

## 2023-04-22 NOTE — Therapy (Deleted)
OUTPATIENT PHYSICAL THERAPY THORACOLUMBAR TREATMENT/ERO   Patient Name: Melinda Charles MRN: [id number] DOB:Oct 28, 1965, 57 y.o., female Today's Date: 04/22/2023  END OF SESSION:      Past Medical History:  Diagnosis Date   Anxiety    Phreesia 06/13/2020   Gastroesophageal reflux disease without esophagitis 09/27/2015   Last Assessment & Plan:  2 month history of symptoms Supplies sent to obtain stool sample for H pylori test Trial H2blocker or PPI/reflux precautions Last Assessment & Plan:  2 month history of symptoms Supplies sent to obtain stool sample for H pylori test Trial H2blocker or PPI/reflux precautions   History of Helicobacter pylori infection    Hyperthyroidism    Varicose veins of leg with pain 04/01/2014   Last Assessment & Plan:  Generalized achiness related to bilateral varicose veins.  Discussed support stockings, extremity elevation, both of which she has done in the past.  She is interested in procedures to alleviate the pain. Last Assessment & Plan:  Generalized achiness related to bilateral varicose veins.  Discussed support stockings, extremity elevation, both of which she has done in the pa   No past surgical history on file. Patient Active Problem List   Diagnosis Date Noted   Chronic myofascial pain 10/23/2022   Graves disease 06/29/2021   Pain and swelling of right wrist 09/18/2020   Upper back pain 09/18/2020   Neck pain 07/18/2020   Hyperthyroidism 10/23/2018   Special screening for malignant neoplasms, colon 02/11/2018   History of Helicobacter pylori infection 02/11/2018   Palpitations 01/22/2018   Unsteadiness 12/30/2017   Gastroesophageal reflux disease without esophagitis 09/27/2015   Corn of foot 04/22/2014   Varicose veins of leg with pain 04/01/2014    PCP: Verlon Au, MD   REFERRING PROVIDER: Horton Chin, MD   REFERRING DIAG: [phone] (ICD-10-CM) - Chronic myofascial pain   Rationale for Evaluation and  Treatment: Rehabilitation  THERAPY DIAG:  No diagnosis found.  ONSET DATE: Two years of chronic myofascial  SUBJECTIVE:                                                                                                                                                                                           SUBJECTIVE STATEMENT: I have been to the gym and I do the UBE, lat pulls, treadmill and I do the rows. I still feel tight.  I still am getting spasms in back neck and top/back of shoulders throughout the day. It seems that it is more intense.  It may be stress. 8/10 pain in upper back and shoulders   PERTINENT HISTORY:  Anxiety, GERD, Hyperthyroidism, varicose veins, vertigo in  past  PAIN:  Are you having pain? Yes: NPRS scale: at rest 8/10   Pain location: all over body pain but mostly in upper shoulders and neck and low back Pain description: intermittent aching and muscle cramping pain Aggravating factors: Exercise like weight training, or lifting  carrying bags for groceries caring for my special needs son with downs syndrome Relieving factors: Not exercising  PRECAUTIONS: None  WEIGHT BEARING RESTRICTIONS: No  FALLS:  Has patient fallen in last 6 months? No  LIVING ENVIRONMENT: Lives with: lives with their family Lives in: House/apartment Stairs:  no issues with stairs Has following equipment at home: None  OCCUPATION: SAHM  PLOF: Independent  PATIENT GOALS: Be able to exercise without residual myofascial pain  NEXT MD VISIT: TBD  OBJECTIVE: (All data from evaluation unless otherwise stated.  DIAGNOSTIC FINDINGS:  None recent  PATIENT SURVEYS:  FOTO 32% predicted 49% 04-01-23  66%  SCREENING FOR RED FLAGS: Bowel or bladder incontinence: No   COGNITION: Overall cognitive status: Within functional limits for tasks assessed     SENSATION: WFL  MUSCLE LENGTH: Hamstrings: Right 80 deg; Left 75 deg   POSTURE: rounded shoulders, forward head, and  anterior pelvic tilt  PALPATION: Overall tenderness , UT tightness bil  BIL UE  WNL CERVICAL ROM:   Active ROM A/PROM (deg) eval AROM 03/07/23 AROM 03/17/23 AROM 04-01-23  Flexion 35     Extension 59     Right lateral flexion 27 34 24 24  Left lateral flexion 20 28 20 18   Right rotation 49 55  60  Left rotation 44 55  55   (Blank rows = not tested)    LUMBAR ROM:   AROM eval 04-01-23  Flexion Fingertips to toes Fingertips to floor   Extension 60% 75%  Right lateral flexion Fingertips to  1 inch above knee jt Fingertip to knee jt line  Left lateral flexion Fingertips to  1 inch above knee jt Fingertips to 1 inch below  Right rotation 50% 75%  Left rotation 50% 75%   (Blank rows = not tested)  LOWER EXTREMITY ROM:     Active  Right eval Left eval  Hip flexion 90 90  Hip extension    Hip abduction    Hip adduction    Hip internal rotation    Hip external rotation    Knee flexion    Knee extension    Ankle dorsiflexion    Ankle plantarflexion    Ankle inversion    Ankle eversion     (Blank rows = not tested)  LOWER EXTREMITY MMT:    MMT Right eval Left eval  Hip flexion    Hip extension    Hip abduction    Hip adduction    Hip internal rotation    Hip external rotation 30 35  Knee flexion    Knee extension    Ankle dorsiflexion    Ankle plantarflexion    Ankle inversion    Ankle eversion     (Blank rows = not tested)  LUMBAR SPECIAL TESTS:  Straight leg raise test: Negative and Slump test: Negative  FUNCTIONAL TESTS:  5 times sit to stand: 9.66  GAIT: Distance walked: 150 Assistive device utilized: None Level of assistance: Complete Independence Comments: No issues with gait   Eval (02-11-23)  03-31-24    Supine single leg bridge:  Not good form  R 27 sec with good form  L  with good form but collapses 20 sec  Supine bridge   can only hold for 67 sec (normal 120 sec 105 sec supine bridge     Sidelying plank from knees: <39'' on L and on R 41"  (norm >30'')   R 45 sec L  45 sec     10/10 pain after exercise 8/10                           TODAY'S TREATMENT:  [facility] Adult PT Treatment:                                                DATE: 04-22-23 Therapeutic Exercise: *** Manual Therapy: *** Neuromuscular re-ed: *** Therapeutic Activity: *** Modalities: *** Self Care: ***  [facility] Adult PT Treatment:                                                DATE: 04-01-23  Therapeutic Exercise: 10# OH press 3 x 8  See table above for side plank, single limb plank, bridge etc Manual Therapy: STW to bil UT/LS Use of towel for self increase of UT stretch  Inhibition tape for UT bil Trigger Point Dry Needling Treatment: Performed by Letitia Libra, DPT Pre-treatment instruction: Patient instructed on dry needling rationale, procedures, and possible side effects including pain during treatment (achy,cramping feeling), bruising, drop of blood, lightheadedness, nausea, sweating. Patient Consent Given: Yes Education handout provided: Yes Muscles treated: BIL UT / LS Needle size and number: .30x64mm x 4 Electrical stimulation performed: No Parameters: N/A Treatment response/outcome: Twitch response elicited and Palpable decrease in muscle tension Post-treatment instructions: Patient instructed to expect possible mild to moderate muscle soreness later today and/or tomorrow. Patient instructed in methods to reduce muscle soreness and to continue prescribed HEP. If patient was dry needled over the lung field, patient was instructed on signs and symptoms of pneumothorax and, however unlikely, to see immediate medical attention should they occur. Patient was also educated on signs and symptoms of infection and to seek medical attention should they occur. Patient verbalized understanding of these instructions and education.  Self Care- Answered several questions about Myofascial release, cardio benefits , aquatic benefits, exercise benefits.  Several  questions regarding gym equipment  [facility] Adult PT Treatment:                                                DATE: 03/17/23 Therapeutic Exercise: Open books x 5 each  Supine horiz abdct Supine diagonals green band  Supine green band ER  x 10  Manual Therapy:  Trigger Point Dry Needling Treatment: Performed by Letitia Libra, DPT Pre-treatment instruction: Patient instructed on dry needling rationale, procedures, and possible side effects including pain during treatment (achy,cramping feeling), bruising, drop of blood, lightheadedness, nausea, sweating. Patient Consent Given: Yes Education handout provided: Yes Muscles treated: BIL UT  Needle size and number: .30x35mm x 4 Electrical stimulation performed: No Parameters: N/A Treatment response/outcome: Twitch response elicited and Palpable decrease in muscle tension Post-treatment instructions: Patient instructed to expect possible mild to moderate muscle soreness later today and/or tomorrow. Patient  instructed in methods to reduce muscle soreness and to continue prescribed HEP. If patient was dry needled over the lung field, patient was instructed on signs and symptoms of pneumothorax and, however unlikely, to see immediate medical attention should they occur. Patient was also educated on signs and symptoms of infection and to seek medical attention should they occur. Patient verbalized understanding of these instructions and education.   STW to bilat upper traps following TPDN, prone Modalities HMP cervical x 10 min      [facility] Adult PT Treatment:                                                DATE: 03/07/23 Therapeutic Exercise: DNF lift 5 sec x 10  Supine Star patter x 10  Supine bilat shoulder ER 10 x 2  Bridge x 10  PPT x 10 PPT with march x 10 LTR x10 SKTC x 2 each  Seated upper trap and levator stretches     [facility] Adult PT Treatment:                                                DATE: 03-04-23 Therapeutic Exercise: Star  pattern green t band 3 x 10 in supine Seated Cervical Retraction Protraction AROM 1 sets - 10 reps Seated Gentle Upper Trapezius Stretch   R and L 2 reps - 30 hold Gentle Levator Scapulae Stretch  2  reps - 30 hold Supine Pelvic Tilt  x 10 SKTC 5 x each side Bridge  10x Manual Therapy: Trigger Point Dry Needling Treatment: Pre-treatment instruction: Patient instructed on dry needling rationale, procedures, and possible side effects including pain during treatment (achy,cramping feeling), bruising, drop of blood, lightheadedness, nausea, sweating. Patient Consent Given: Yes Education handout provided: Yes Muscles treated: BIL UT  Needle size and number: .30x57mm x 4 Electrical stimulation performed: No Parameters: N/A Treatment response/outcome: Twitch response elicited and Palpable decrease in muscle tension Post-treatment instructions: Patient instructed to expect possible mild to moderate muscle soreness later today and/or tomorrow. Patient instructed in methods to reduce muscle soreness and to continue prescribed HEP. If patient was dry needled over the lung field, patient was instructed on signs and symptoms of pneumothorax and, however unlikely, to see immediate medical attention should they occur. Patient was also educated on signs and symptoms of infection and to seek medical attention should they occur. Patient verbalized understanding of these instructions and education.   Modalities: Moist hot pack Self Care: DOMS explanation  and also importance for cardio for myofascial pain and weight lifting for aging  DATE: 02-11-23  Eval    PATIENT EDUCATION:  Education details: POC Explanation of findings and FOTO report Person educated: Patient Education method: Explanation and Handouts Education comprehension: verbalized understanding and needs further  education  HOME EXERCISE PROGRAM: Access Code: [email or web address] URL: [email or web address] Date: 02/11/2023 Prepared by: Garen Lah  Exercises - Supine Deep Neck Flexor Training  - 2-3 x daily - 7 x weekly - 10 reps - 3 hold - Seated Cervical Retraction Protraction AROM  - 2-3 x daily - 7 x weekly - 1 sets - 10 reps - Seated Gentle Upper Trapezius Stretch  - 1 x daily - 7 x weekly - 1 sets - 3 reps - 30 hold - Gentle Levator Scapulae Stretch  - 1 x daily - 7 x weekly - 3 sets - 3 reps - 30 hold - Supine Pelvic Tilt  - 1 x daily - 7 x weekly - 3 sets - 10 reps - Supine Single Knee to Chest Stretch  - 2 x daily - 7 x weekly - 1 sets - 5 reps - 10 hold - Supine Lower Trunk Rotation  - 2 x daily - 7 x weekly - 1 sets - 5 reps - 20 hold - Supine Bridge  - 1 x daily - 7 x weekly - 2 sets - 10 reps Added 03-04-23 - Standing (perform Supine) Shoulder Horizontal Abduction with Resistance  - 1 x daily - 7 x weekly - 3 sets - 10 reps - Standing( perform supine)  Shoulder Diagonal Horizontal Abduction 60/120 Degrees with Resistance  - 1 x daily - 7 x weekly - 3 sets - 10 reps  ASSESSMENT:  CLINICAL IMPRESSION: Judean  reports she went to the gym this week and completed UE machines and had increased pain for several days. She reports compliance with HEP. Pt desires to work out and increase weights in gym.  Pt needs more time in order to build strength to safely continue at gym.  Pt today had ERO and has several goals achieved but Pain and high level continues to pose a problem and limiting her continuing ability to perform progressive exercises. Pt modified band exercises to supine with some decrease in pain. Pt also consented to TPDN and had several twitch responses and decreased tightness.   Pt felt soreness after TPDN and was treated with STW  and inhibition tape. Goals revised and achieved as indicated below. PT lateral Neck AROM still limited but cervical rotation is improved.Kentara would  benefit from 1 x a week for 8 weeks for time to have increase in muscle grade strength.     EVAL- Patient is a 57 y.o. female who was seen today for physical therapy evaluation and treatment for chronic myofascial pain in neck and low back. Pt has special needs son she cares for and she is not be able to exercise due to chronic myofascial pain.   Pt used to be able to exercise regularly but has not in the past year due to increasing pain up to 10/10 after exercise.  Ms Carolyne Littles would like to be able to return to more active life and manage symptoms of chronic myofascial pain. Pt will benefit from skilled PT to address impairments and educate on HEP and self care for condition.  OBJECTIVE IMPAIRMENTS: decreased ROM, decreased strength, improper body mechanics, postural dysfunction, and pain.   ACTIVITY LIMITATIONS: carrying, lifting, bending, and squatting  PARTICIPATION LIMITATIONS: driving, community activity, and caring for special needs son and exercing for health  PERSONAL FACTORS: Anxiety, GERD, Hyperthyroidism, varicose veins, vertigo in past are also affecting patient's functional outcome.   REHAB POTENTIAL: Good  CLINICAL DECISION MAKING: Evolving/moderate complexity  EVALUATION COMPLEXITY: Moderate   GOALS: Goals reviewed with patient? Yes  SHORT TERM GOALS: Target date: 03-06-23  Pain after exercise will decrease from 10/10 down to 7/10 Baseline: 10/10 and prefers not to exercise due to pain 03/07/23: went to gym this week without increased pain  Goal status: MET  2.  Pt will try aquatics to add exercise to routine and develop HEP Baseline: No knowledge of aquatics Goal status: DEFERRED -pt unable to attend   3.  Pt will be independent with [place] initial HEP Baseline: limited knowledge Goal status: MET    LONG TERM GOALS: Target date: 03-28-23 revised for 05-27-23  Pt will be independent with advanced HEP and be able to return to gym wt training Baseline: at  present haven't returned to gym 04-01-23  able to return to gym but does not have HEP advanced down yet.  Goal status: ONGOING  2.  Pt will have 50% decreased tension in cervical and shoulder after exercise and be able to manage flare ups when increased Baseline: 10/10 03/07/23: able to attend gym this week without increased neck.shoulder tension 04-01-23 8/10 Goal status: ONGOING  3.  FOTO will improve from  32%  to  49%   indicating improved functional mobility.  Baseline: 32% eval 04-01-23 66% Goal status: MET  4.  Pt will be able to carry 25 lbs of groceries without exacerbating pain greater than 3/10 Baseline: Avoids carrying items due to stress on neck STatus 04-01-23 able to carry 10 lb weight in each hand OH press x 8 Goal status: ONGOING  5.  Demonstrate and verbalize techniques to reduce the risk of re-injury including: lifting, posture, body  and mechanics and demonstrate proper deadlift form Baseline: limited knoweldge,  pt with forward and flexed posture Goal status: Revised  6.  Improved cervical rotation to allow checking blind spots while driving with minimal discomfort Baseline: some limitation with muscle tightness and spams 04-01-23  improved See chart but with spasms minimal discomfort Goal status: MET  PLAN:  PT FREQUENCY: 2x/week  revised 1 x  a week  PT DURATION: 6 weeks  revised  for 8 weeks  PLANNED INTERVENTIONS: Therapeutic exercises, Therapeutic activity, Neuromuscular re-education, Balance training, Gait training, Patient/Family education, Self Care, Joint mobilization, Aquatic Therapy, Dry Needling, Electrical stimulation, Cryotherapy, Moist heat, Taping, Manual therapy, and Re-evaluation.  PLAN FOR NEXT SESSION: Issue HEP for neck basic and basic back.and educate patient      Check all possible CPT codes: 84696 - PT Re-evaluation, 97110- Therapeutic Exercise, [phone]- Neuro Re-education, [phone] - Gait Training, [phone] - Manual Therapy, [phone] - Therapeutic  Activities, [phone] - Self Care, and [phone] - Electrical stimulation (Manual)    Check all conditions that are expected to impact treatment: {Conditions expected to impact treatment:Musculoskeletal disorders   If treatment provided at initial evaluation, no treatment charged due to lack of authorization.      ***

## 2023-04-29 DIAGNOSIS — S99921A Unspecified injury of right foot, initial encounter: Secondary | ICD-10-CM | POA: Diagnosis not present

## 2023-04-29 DIAGNOSIS — M7989 Other specified soft tissue disorders: Secondary | ICD-10-CM | POA: Diagnosis not present

## 2023-04-29 DIAGNOSIS — M79671 Pain in right foot: Secondary | ICD-10-CM | POA: Diagnosis not present

## 2023-06-30 ENCOUNTER — Ambulatory Visit: Payer: Medicaid Other | Admitting: Internal Medicine

## 2023-06-30 ENCOUNTER — Encounter: Payer: Self-pay | Admitting: Internal Medicine

## 2023-06-30 ENCOUNTER — Ambulatory Visit: Payer: Medicaid Other | Admitting: Advanced Practice Midwife

## 2023-06-30 VITALS — BP 114/72 | HR 86 | Ht 64.0 in | Wt 180.0 lb

## 2023-06-30 DIAGNOSIS — Z8639 Personal history of other endocrine, nutritional and metabolic disease: Secondary | ICD-10-CM | POA: Diagnosis not present

## 2023-06-30 LAB — T4, FREE: Free T4: 1.07 ng/dL (ref 0.60–1.60)

## 2023-06-30 LAB — TSH: TSH: 3 u[IU]/mL (ref 0.35–5.50)

## 2023-06-30 LAB — T3, FREE: T3, Free: 3.8 pg/mL (ref 2.3–4.2)

## 2023-06-30 NOTE — Progress Notes (Unsigned)
Name: Melinda Charles  MRN/ DOB: [id number], Apr 09, 1966    Age/ Sex: 57 y.o., female    PCP: Verlon Au, MD   Reason for Endocrinology Evaluation: Hyperthyroidism     Date of Initial Endocrinology Evaluation: 06/29/2021    HPI: Melinda Charles is a 57 y.o. female with a past medical history of GERD and Hyperthyroidism. The patient presented for initial endocrinology clinic visit on 06/29/2021  for consultative assistance with her Hyperthyroidism.   Transferred care from Dr. Marlowe Kays with [facility]   She was diagnosed with hyperthyroidism in 09/2018 . This has  been attributed to Graves' disease with a TSI elevated at 4 . This was diagnosed during evaluation  for Vertigo , she was also having panic attacks at the time.     She has been on Methimazole from 2020 , she self discontinued in 2022  Repeat TFTs and clinic were normal, and we opted to hold off on thionamides therapy.  Patient was lost to follow-up until her return to our clinic in 2024    No Fh of thyroid disease   SUBJECTIVE:    Today (06/30/23): Melinda Charles is here for a follow up on hyperthyroidism. She has NOT been to our clinic in 2 years.    Weight has been trending up gradually over the past 2 years She has neck muscular pain , treated by PCP associated with leg pains  Has noted fatigue  Has occasional palpitations with muscle pain Denies loose stools or diarrhea  Denies hand tremors  No recent anxiety  NO local neck swelling   HISTORY:  Past Medical History:  Past Medical History:  Diagnosis Date   Anxiety    Phreesia 06/13/2020   Gastroesophageal reflux disease without esophagitis 09/27/2015   Last Assessment & Plan:  2 month history of symptoms Supplies sent to obtain stool sample for H pylori test Trial H2blocker or PPI/reflux precautions Last Assessment & Plan:  2 month history of symptoms Supplies sent to obtain stool sample for H pylori test Trial H2blocker or  PPI/reflux precautions   History of Helicobacter pylori infection    Hyperthyroidism    Varicose veins of leg with pain 04/01/2014   Last Assessment & Plan:  Generalized achiness related to bilateral varicose veins.  Discussed support stockings, extremity elevation, both of which she has done in the past.  She is interested in procedures to alleviate the pain. Last Assessment & Plan:  Generalized achiness related to bilateral varicose veins.  Discussed support stockings, extremity elevation, both of which she has done in the pa   Past Surgical History: No past surgical history on file.  Social History:  reports that she has never smoked. She has never been exposed to tobacco smoke. She has never used smokeless tobacco. She reports that she does not drink alcohol and does not use drugs. Family History: family history includes Diabetes in her maternal grandfather; Emphysema in her father; GER disease in her maternal uncle; Heart disease in her paternal grandfather; Hypertension in her father, maternal grandmother, mother, and paternal grandmother.   HOME MEDICATIONS: Allergies as of 06/30/2023   No Known Allergies      Medication List        Accurate as of June 30, 2023  8:39 AM. If you have any questions, ask your nurse or doctor.          ibuprofen 800 MG tablet Commonly known as: ADVIL as needed.   multivitamin tablet Take 1 tablet by  mouth daily.   VITAMIN C PO Take by mouth.   VITAMIN D PO Take by mouth.          REVIEW OF SYSTEMS: A comprehensive ROS was conducted with the patient and is negative except as per HPI     OBJECTIVE:  VS: BP 114/72 (BP Location: Left Arm, Patient Position: Sitting, Cuff Size: Large)   Pulse 86   Ht 5\' 4"  (1.626 m)   Wt 180 lb (81.6 kg)   SpO2 96%   BMI 30.90 kg/m    Wt Readings from Last 3 Encounters:  06/30/23 180 lb (81.6 kg)  04/10/23 177 lb 6.4 oz (80.5 kg)  01/14/23 173 lb (78.5 kg)     EXAM: General: Pt appears  well and is in NAD  Neck: General: Supple without adenopathy. Thyroid: Thyroid size normal.  No goiter or nodules appreciated. No thyroid bruit.  Lungs: Clear with good BS bilat   Heart: Auscultation: RRR.  Abdomen:  soft, nontender  Extremities:  BL LE: No pretibial edema  Mental Status: Judgment, insight: Intact Orientation: Oriented to time, place, and person Mood and affect: No depression, anxiety, or agitation     DATA REVIEWED:    Latest Reference Range & Units 06/30/23 08:58  TSH 0.35 - 5.50 uIU/mL 3.00  Triiodothyronine,Free,Serum 2.3 - 4.2 pg/mL 3.8  T4,Free(Direct) 0.60 - 1.60 ng/dL 1.61    ASSESSMENT/PLAN/RECOMMENDATIONS:   1. Graves' Disease:   -Patient is clinically euthyroid -No local neck symptoms -Repeat TFTs remain within normal range, no indication for thionamides therapy. -Reassurance provided, patient understands that Graves' disease could relapse at any time and there is no prediction of timing, emphasized the importance of managing stress properly  Follow-up with PCP    Signed electronically by: Lyndle Herrlich, MD  [facility] Endocrinology  [facility] [address] [place] [place], [place] [address] Phone: [phone] FAX: [phone]   CC: Verlon Au, MD [address] [place] [place] [address] Phone: [phone] Fax: [phone]   Return to Endocrinology clinic as below: Future Appointments  Date Time Provider Department Center  07/11/2023 10:40 AM Carlis Abbott, Drema Pry, MD [facility] CPR  07/14/2023  9:55 AM Nobie Putnam, Cyndi Lennert, MD [facility] None

## 2023-07-11 ENCOUNTER — Encounter
Payer: Medicaid Other | Attending: Physical Medicine and Rehabilitation | Admitting: Physical Medicine and Rehabilitation

## 2023-07-11 DIAGNOSIS — G8929 Other chronic pain: Secondary | ICD-10-CM | POA: Insufficient documentation

## 2023-07-11 DIAGNOSIS — M7918 Myalgia, other site: Secondary | ICD-10-CM | POA: Insufficient documentation

## 2023-07-14 ENCOUNTER — Ambulatory Visit: Payer: Medicaid Other | Admitting: Family Medicine

## 2023-08-06 ENCOUNTER — Ambulatory Visit: Payer: Medicaid Other | Admitting: Obstetrics and Gynecology

## 2023-08-06 ENCOUNTER — Other Ambulatory Visit (HOSPITAL_COMMUNITY)
Admission: RE | Admit: 2023-08-06 | Discharge: 2023-08-06 | Disposition: A | Discharge status: Home / Self Care | Payer: Medicaid Other | Source: Ambulatory Visit | Attending: Obstetrics and Gynecology | Admitting: Obstetrics and Gynecology

## 2023-08-06 ENCOUNTER — Encounter: Payer: Self-pay | Admitting: Obstetrics and Gynecology

## 2023-08-06 VITALS — BP 130/78 | HR 68 | Ht 64.0 in | Wt 182.0 lb

## 2023-08-06 DIAGNOSIS — N942 Vaginismus: Secondary | ICD-10-CM

## 2023-08-06 DIAGNOSIS — Z01419 Encounter for gynecological examination (general) (routine) without abnormal findings: Secondary | ICD-10-CM | POA: Diagnosis not present

## 2023-08-06 DIAGNOSIS — Z1151 Encounter for screening for human papillomavirus (HPV): Secondary | ICD-10-CM

## 2023-08-06 NOTE — Progress Notes (Deleted)
Patient not seen by provider. Appointment was rescheduled

## 2023-08-06 NOTE — Progress Notes (Signed)
Subjective:     Melinda Charles is a 57 y.o. female postmenopausal with BMI 31 who is here for a comprehensive physical exam. The patient reports muscle spasm in the vagina worst when she is sitting. These symptoms have been present over the past few months. She is not sexually active and denies a history of perineal trauma. Patient states that the vaginismus is present throughout the day and she is more aware of it when sitting. She denies any urinary incontinence. She denies issues with bowel movement. She is without any other complaints.  Past Medical History:  Diagnosis Date   Anxiety    Phreesia 06/13/2020   Gastroesophageal reflux disease without esophagitis 09/27/2015   Last Assessment & Plan:  2 month history of symptoms Supplies sent to obtain stool sample for H pylori test Trial H2blocker or PPI/reflux precautions Last Assessment & Plan:  2 month history of symptoms Supplies sent to obtain stool sample for H pylori test Trial H2blocker or PPI/reflux precautions   History of Helicobacter pylori infection    Hyperthyroidism    Varicose veins of leg with pain 04/01/2014   Last Assessment & Plan:  Generalized achiness related to bilateral varicose veins.  Discussed support stockings, extremity elevation, both of which she has done in the past.  She is interested in procedures to alleviate the pain. Last Assessment & Plan:  Generalized achiness related to bilateral varicose veins.  Discussed support stockings, extremity elevation, both of which she has done in the pa   No past surgical history on file. Family History  Problem Relation Age of Onset   Hypertension Mother    Emphysema Father    Hypertension Father    Hypertension Maternal Grandmother    Diabetes Maternal Grandfather    Hypertension Paternal Grandmother    Heart disease Paternal Grandfather    GER disease Maternal Uncle    Social History   Socioeconomic History   Marital status: Single    Spouse name: Not on file    Number of children: Not on file   Years of education: Not on file   Highest education level: Not on file  Occupational History   Not on file  Tobacco Use   Smoking status: Never    Passive exposure: Never   Smokeless tobacco: Never  Vaping Use   Vaping status: Never Used  Substance and Sexual Activity   Alcohol use: Never   Drug use: Never   Sexual activity: Not on file  Other Topics Concern   Not on file  Social History Narrative   Not on file   Social Determinants of Health   Financial Resource Strain: Not on file  Food Insecurity: Low Risk  (04/29/2023)   Received from [facility]   Hunger Vital Sign    Worried About Running Out of Food in the Last Year: Never true    Ran Out of Food in the Last Year: Never true  Transportation Needs: No Transportation Needs (04/29/2023)   Received from [facility]    In the past 12 months, has lack of reliable transportation kept you from medical appointments, meetings, work or from getting things needed for daily living? : No  Physical Activity: Not on file  Stress: Not on file  Social Connections: Not on file  Intimate Partner Violence: Not on file   Health Maintenance  Topic Date Due   Hepatitis C Screening  Never done   DTaP/Tdap/Td (1 - Tdap) Never done   Zoster Vaccines-  Shingrix (1 of 2) Never done   MAMMOGRAM  10/14/2020   INFLUENZA VACCINE  Never done   COVID-19 Vaccine (1 - 2023-24 season) Never done   Cervical Cancer Screening (HPV/Pap Cotest)  11/23/2024   Colonoscopy  04/23/2028   HIV Screening  Completed   HPV VACCINES  Aged Out       Review of Systems Pertinent items noted in HPI and remainder of comprehensive ROS otherwise negative.   Objective:  Blood pressure 130/78, pulse 68, height 5\' 4"  (1.626 m), weight 182 lb (82.6 kg).   GENERAL: Well-developed, well-nourished female in no acute distress.  HEENT: Normocephalic, atraumatic. Sclerae anicteric.  NECK: Supple. Normal thyroid.   LUNGS: Clear to auscultation bilaterally.  HEART: Regular rate and rhythm. BREASTS: Symmetric in size. No palpable masses or lymphadenopathy, skin changes, or nipple drainage. ABDOMEN: Soft, nontender, nondistended. No organomegaly. PELVIC: Normal external female genitalia. Vagina is pink and rugated.  Normal discharge. Normal appearing cervix. Uterus is normal in size. No adnexal mass or tenderness. No pain during exam nor was vaginismus reproduced. Chaperone present during the pelvic exam EXTREMITIES: No cyanosis, clubbing, or edema, 2+ distal pulses.     Assessment:    Healthy female exam.      Plan:    Pap smear collected Vaginal swab collected to rule out yeast or BV Screening mammogram ordered Patient current on colonoscopy Patient will be contacted with abnormal results Patient referred to physical therapy If no improvement, patient to follow up with PCP See After Visit Summary for Counseling Recommendations

## 2023-08-07 LAB — CYTOLOGY - PAP
Comment: NEGATIVE
Diagnosis: NEGATIVE
High risk HPV: NEGATIVE

## 2023-08-07 LAB — CERVICOVAGINAL ANCILLARY ONLY
Bacterial Vaginitis (gardnerella): NEGATIVE
Candida Glabrata: NEGATIVE
Candida Vaginitis: NEGATIVE
Comment: NEGATIVE
Comment: NEGATIVE
Comment: NEGATIVE

## 2023-08-22 DIAGNOSIS — N951 Menopausal and female climacteric states: Secondary | ICD-10-CM | POA: Diagnosis not present

## 2023-08-22 DIAGNOSIS — E039 Hypothyroidism, unspecified: Secondary | ICD-10-CM | POA: Diagnosis not present

## 2023-09-01 DIAGNOSIS — Z6830 Body mass index (BMI) 30.0-30.9, adult: Secondary | ICD-10-CM | POA: Diagnosis not present

## 2023-09-01 DIAGNOSIS — R6882 Decreased libido: Secondary | ICD-10-CM | POA: Diagnosis not present

## 2023-09-01 DIAGNOSIS — E039 Hypothyroidism, unspecified: Secondary | ICD-10-CM | POA: Diagnosis not present

## 2023-09-01 DIAGNOSIS — N951 Menopausal and female climacteric states: Secondary | ICD-10-CM | POA: Diagnosis not present

## 2023-09-01 DIAGNOSIS — R232 Flushing: Secondary | ICD-10-CM | POA: Diagnosis not present

## 2023-10-13 DIAGNOSIS — N951 Menopausal and female climacteric states: Secondary | ICD-10-CM | POA: Diagnosis not present

## 2023-10-13 DIAGNOSIS — E039 Hypothyroidism, unspecified: Secondary | ICD-10-CM | POA: Diagnosis not present

## 2023-10-19 ENCOUNTER — Other Ambulatory Visit: Payer: Self-pay

## 2023-10-19 ENCOUNTER — Ambulatory Visit
Admission: EM | Admit: 2023-10-19 | Discharge: 2023-10-19 | Disposition: A | Discharge status: Home / Self Care | Payer: Medicaid Other

## 2023-10-19 DIAGNOSIS — J209 Acute bronchitis, unspecified: Secondary | ICD-10-CM | POA: Diagnosis not present

## 2023-10-19 LAB — POC COVID19/FLU A&B COMBO
Covid Antigen, POC: NEGATIVE
Influenza A Antigen, POC: NEGATIVE
Influenza B Antigen, POC: NEGATIVE

## 2023-10-19 MED ORDER — DOXYCYCLINE HYCLATE 100 MG PO CAPS
100.0000 mg | ORAL_CAPSULE | Freq: Two times a day (BID) | ORAL | 0 refills | Status: DC
Start: 2023-10-19 — End: 2024-01-14

## 2023-10-19 MED ORDER — ALBUTEROL SULFATE HFA 108 (90 BASE) MCG/ACT IN AERS
2.0000 | INHALATION_SPRAY | Freq: Four times a day (QID) | RESPIRATORY_TRACT | 2 refills | Status: AC | PRN
Start: 2023-10-19 — End: ?

## 2023-10-19 NOTE — Discharge Instructions (Addendum)
 Return if any problems.

## 2023-10-19 NOTE — ED Triage Notes (Signed)
 Pt reports she has a  cough and body aches x 4 days and a nausea x2 days

## 2023-10-21 NOTE — ED Provider Notes (Signed)
 [name]    [name]: [id number] Arrival date & time: 10/19/23  1334      History   Chief Complaint No chief complaint on file.   HPI Melinda Charles is a 58 y.o. female.   Pt complains of cough and congestion.  Pt reports body aches for the past 4 days.  Pt complains of nausea for the past 2 days      Past Medical History:  Diagnosis Date   Anxiety    Phreesia 06/13/2020   Gastroesophageal reflux disease without esophagitis 09/27/2015   Last Assessment & Plan:  2 month history of symptoms Supplies sent to obtain stool sample for H pylori test Trial H2blocker or PPI/reflux precautions Last Assessment & Plan:  2 month history of symptoms Supplies sent to obtain stool sample for H pylori test Trial H2blocker or PPI/reflux precautions   History of Helicobacter pylori infection    Hyperthyroidism    Varicose veins of leg with pain 04/01/2014   Last Assessment & Plan:  Generalized achiness related to bilateral varicose veins.  Discussed support stockings, extremity elevation, both of which she has done in the past.  She is interested in procedures to alleviate the pain. Last Assessment & Plan:  Generalized achiness related to bilateral varicose veins.  Discussed support stockings, extremity elevation, both of which she has done in the pa    Patient Active Problem List   Diagnosis Date Noted   Chronic myofascial pain 10/23/2022   Graves disease 06/29/2021   Pain and swelling of right wrist 09/18/2020   Upper back pain 09/18/2020   Neck pain 07/18/2020   Hyperthyroidism 10/23/2018   Special screening for malignant neoplasms, colon 02/11/2018   History of Helicobacter pylori infection 02/11/2018   Palpitations 01/22/2018   Unsteadiness 12/30/2017   Gastroesophageal reflux disease without esophagitis 09/27/2015   Corn of foot 04/22/2014   Varicose veins of leg with pain 04/01/2014    Past Surgical History:  Procedure Laterality Date   NO PAST SURGERIES       OB History     Gravida  4   Para      Term      Preterm      AB  2   Living  2      SAB      IAB      Ectopic      Multiple      Live Births  2            Home Medications    Prior to Admission medications   Medication Sig Start Date End Date Taking? Authorizing Provider  albuterol (VENTOLIN HFA) 108 (90 Base) MCG/ACT inhaler Inhale 2 puffs into the lungs every 6 (six) hours as needed for wheezing or shortness of breath. 10/19/23  Yes Cheron Schaumann K, PA-C  doxycycline (VIBRAMYCIN) 100 MG capsule Take 1 capsule (100 mg total) by mouth 2 (two) times daily. 10/19/23  Yes Cheron Schaumann K, PA-C  estradiol (VIVELLE-DOT) 0.025 MG/24HR  09/10/23  Yes [provider]  progesterone (PROMETRIUM) 100 MG capsule  09/29/23  Yes [provider]  Ascorbic Acid (VITAMIN C PO) Take by mouth.    [provider]  ibuprofen (ADVIL) 800 MG tablet as needed. 07/11/20   [provider]  Multiple Vitamin (MULTIVITAMIN) tablet Take 1 tablet by mouth daily.    [provider]  VITAMIN D PO Take by mouth.    [provider]    Family History Family  History  Problem Relation Age of Onset   Hypertension Mother    Emphysema Father    Hypertension Father    Hypertension Maternal Grandmother    Diabetes Maternal Grandfather    Hypertension Paternal Grandmother    Heart disease Paternal Grandfather    GER disease Maternal Uncle     Social History Social History   Tobacco Use   Smoking status: Never    Passive exposure: Never   Smokeless tobacco: Never  Vaping Use   Vaping status: Never Used  Substance Use Topics   Alcohol use: Not Currently   Drug use: Never     Allergies   Patient has no known allergies.   Review of Systems Review of Systems  All other systems reviewed and are negative.    Physical Exam Triage Vital Signs ED Triage Vitals [10/19/23 1400]  Encounter Vitals Group     BP 117/73     Systolic  BP Percentile      Diastolic BP Percentile      Pulse Rate 66     Resp 18     Temp 98.4 F (36.9 C)     Temp Source Oral     SpO2 96 %     Weight      Height      Head Circumference      Peak Flow      Pain Score 5     Pain Loc      Pain Education      Exclude from Growth Chart    No data found.  Updated Vital Signs BP 117/73 (BP Location: Left Arm)   Pulse 66   Temp 98.4 F (36.9 C) (Oral)   Resp 18   SpO2 96%   Visual Acuity Right Eye Distance:   Left Eye Distance:   Bilateral Distance:    Right Eye Near:   Left Eye Near:    Bilateral Near:     Physical Exam Vitals and nursing note reviewed.  Constitutional:      Appearance: She is well-developed.  HENT:     Head: Normocephalic.  Cardiovascular:     Rate and Rhythm: Normal rate.  Pulmonary:     Effort: Pulmonary effort is normal.  Abdominal:     General: There is no distension.  Musculoskeletal:        General: Normal range of motion.     Cervical back: Normal range of motion.  Skin:    General: Skin is warm.  Neurological:     General: No focal deficit present.     Mental Status: She is alert and oriented to person, place, and time.      UC Treatments / Results  Labs (all labs ordered are listed, but only abnormal results are displayed) Labs Reviewed  POC COVID19/FLU A&B COMBO - Normal    EKG   Radiology No results found.  Procedures Procedures (including critical care time)  Medications Ordered in UC Medications - No data to display  Initial Impression / Assessment and Plan / UC Course  I have reviewed the triage vital signs and the nursing notes.  Pertinent labs & imaging results that were available during my care of the patient were reviewed by me and considered in my medical decision making (see chart for details).      Final Clinical Impressions(s) / UC Diagnoses   Final diagnoses:  Acute bronchitis, unspecified organism     Discharge Instructions      Return if  any problems.    ED Prescriptions     Medication Sig Dispense Auth. Provider   doxycycline (VIBRAMYCIN) 100 MG capsule Take 1 capsule (100 mg total) by mouth 2 (two) times daily. 20 capsule Eveny Anastas K, [place]   albuterol (VENTOLIN HFA) 108 (90 Base) MCG/ACT inhaler Inhale 2 puffs into the lungs every 6 (six) hours as needed for wheezing or shortness of breath. 8 g Elson Areas, [place]      PDMP not reviewed this encounter.    An After Visit Summary was printed and given to the patient.    Elson Areas, [place] 10/21/23 1949

## 2023-11-06 ENCOUNTER — Ambulatory Visit: Payer: Medicaid Other

## 2023-11-07 DIAGNOSIS — R6882 Decreased libido: Secondary | ICD-10-CM | POA: Diagnosis not present

## 2023-11-07 DIAGNOSIS — N951 Menopausal and female climacteric states: Secondary | ICD-10-CM | POA: Diagnosis not present

## 2023-11-07 DIAGNOSIS — Z6831 Body mass index (BMI) 31.0-31.9, adult: Secondary | ICD-10-CM | POA: Diagnosis not present

## 2023-11-07 DIAGNOSIS — E039 Hypothyroidism, unspecified: Secondary | ICD-10-CM | POA: Diagnosis not present

## 2023-11-07 DIAGNOSIS — R232 Flushing: Secondary | ICD-10-CM | POA: Diagnosis not present

## 2023-11-13 ENCOUNTER — Ambulatory Visit: Payer: Medicaid Other | Attending: Obstetrics and Gynecology

## 2023-11-13 ENCOUNTER — Other Ambulatory Visit: Payer: Self-pay

## 2023-11-13 DIAGNOSIS — M62838 Other muscle spasm: Secondary | ICD-10-CM | POA: Diagnosis not present

## 2023-11-13 DIAGNOSIS — R279 Unspecified lack of coordination: Secondary | ICD-10-CM | POA: Insufficient documentation

## 2023-11-13 DIAGNOSIS — M6281 Muscle weakness (generalized): Secondary | ICD-10-CM | POA: Diagnosis not present

## 2023-11-13 DIAGNOSIS — R293 Abnormal posture: Secondary | ICD-10-CM | POA: Insufficient documentation

## 2023-11-13 DIAGNOSIS — M5459 Other low back pain: Secondary | ICD-10-CM | POA: Diagnosis not present

## 2023-11-13 DIAGNOSIS — N942 Vaginismus: Secondary | ICD-10-CM | POA: Insufficient documentation

## 2023-11-13 DIAGNOSIS — R102 Pelvic and perineal pain: Secondary | ICD-10-CM | POA: Insufficient documentation

## 2023-11-13 NOTE — Therapy (Signed)
 OUTPATIENT PHYSICAL THERAPY FEMALE PELVIC EVALUATION   Patient Name: Melinda Charles MRN: [id number] DOB:[birth year], 58 y.o., female Today's Date: 11/13/2023  END OF SESSION:  PT End of Session - 11/13/23 1105     Visit Number 1    Date for PT Re-Evaluation 04/29/24    Authorization Type [employer]    Authorization Time Period waiting on approval    PT Start Time 1103    PT Stop Time 1145    PT Time Calculation (min) 42 min    Activity Tolerance Patient tolerated treatment well    Behavior During Therapy WFL for tasks assessed/performed             Past Medical History:  Diagnosis Date   Anxiety    Phreesia 06/13/2020   Gastroesophageal reflux disease without esophagitis 09/27/2015   Last Assessment & Plan:  2 month history of symptoms Supplies sent to obtain stool sample for H pylori test Trial H2blocker or PPI/reflux precautions Last Assessment & Plan:  2 month history of symptoms Supplies sent to obtain stool sample for H pylori test Trial H2blocker or PPI/reflux precautions   History of Helicobacter pylori infection    Hyperthyroidism    Varicose veins of leg with pain 04/01/2014   Last Assessment & Plan:  Generalized achiness related to bilateral varicose veins.  Discussed support stockings, extremity elevation, both of which she has done in the past.  She is interested in procedures to alleviate the pain. Last Assessment & Plan:  Generalized achiness related to bilateral varicose veins.  Discussed support stockings, extremity elevation, both of which she has done in the pa   Past Surgical History:  Procedure Laterality Date   NO PAST SURGERIES     Patient Active Problem List   Diagnosis Date Noted   Chronic myofascial pain 10/23/2022   Graves disease 06/29/2021   Pain and swelling of right wrist 09/18/2020   Upper back pain 09/18/2020   Neck pain 07/18/2020   Hyperthyroidism 10/23/2018   Special screening for malignant neoplasms, colon 02/11/2018   History  of Helicobacter pylori infection 02/11/2018   Palpitations 01/22/2018   Unsteadiness 12/30/2017   Gastroesophageal reflux disease without esophagitis 09/27/2015   Corn of foot 04/22/2014   Varicose veins of leg with pain 04/01/2014    PCP: Verlon Au, MD  REFERRING PROVIDER: Catalina Antigua, MD   REFERRING DIAG: N94.2 (ICD-10-CM) - Vaginismus  THERAPY DIAG:  Other muscle spasm  Unspecified lack of coordination  Muscle weakness (generalized)  Abnormal posture  Pelvic pain  Other low back pain  Rationale for Evaluation and Treatment: Rehabilitation  ONSET DATE: 4 months  SUBJECTIVE:  SUBJECTIVE STATEMENT: Pt is having muscle spasm in vagina that is worse when she is sitting. She denies any bowel or bladder issues. She is not sexually active. She states that she can feel her muscles contracting all the time in the vagina. This starts to radiate into her low back and down her thighs.  Fluid intake:   PAIN:  Are you having pain? Yes NPRS scale: 8/10 Pain location: Vaginal and low back/bil thighs  Pain type: spasm, cramping Pain description: intermittent   Aggravating factors: sitting Relieving factors: walking  PRECAUTIONS: None  RED FLAGS: None   WEIGHT BEARING RESTRICTIONS: No  FALLS:  Has patient fallen in last 6 months? 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  ACTIVITY LEVEL : none currently  PLOF: Independent  PATIENT GOALS: decrease pain and vaginal spasm  PERTINENT HISTORY:  GERD, hyperthyroidism, history of chronic myofascial pain Sexual abuse: No  BOWEL MOVEMENT: Pain with bowel movement: No Type of bowel movement:Frequency 1-2x/day and Strain none currently Fully empty rectum: Yes:   Leakage: No Pads: No Fiber supplement/laxative  No  URINATION: Pain with urination: No Fully empty bladder: Yes:   Stream: Strong Urgency: Not usually Frequency: every 3 or 4 hours  Leakage: none Pads: No  INTERCOURSE: Not sexually active, but no history of pain  PREGNANCY: Vaginal deliveries 2 Tearing Yes: with first Episiotomy No C-section deliveries 0 Currently pregnant No  PROLAPSE: None   OBJECTIVE:  Note: Objective measures were completed at Evaluation unless otherwise noted. 11/13/23:  PATIENT SURVEYS:   PFIQ-7: 100  COGNITION: Overall cognitive status: Within functional limits for tasks assessed     SENSATION: Light touch: Appears intact   FUNCTIONAL TESTS:  Squat: uncomfortable, limited Single leg stance:  Rt: pelvic drop, unsteady  Lt: pelvic drop, unsteady Curl-up test: abdominal distortion throughout   GAIT: Assistive device utilized: None Comments: rigid trunk, decreased rotation   POSTURE: forward head, increased lumbar lordosis, decreased thoracic kyphosis, and anterior pelvic tilt   LUMBARAROM/PROM:  A/PROM A/PROM  eval  Flexion 80  Extension Does not make it neutral   Right lateral flexion 50  Left lateral flexion 50  Right rotation 25  Left rotation 25   (Blank rows = not tested)   PALPATION:   General: spasm in bil lumbar paraspinals and glutes   Pelvic Alignment: WNL  Abdominal: decreased rib cage mobility, apical breathing pattern                External Perineal Exam: dryness, discoloration, labial fusion, phimosis                             Internal Pelvic Floor: dry, tender throughout, significant tension Lt>Rt  Patient confirms identification and approves PT to assess internal pelvic floor and treatment Yes  PELVIC MMT:   MMT eval  Vaginal 1/5, 4 repeat contractions, 2 second hold, very difficult time with relaxation, decreased ROM over the course of repeat contractions   Diastasis Recti 2 finger widths   (Blank rows = not tested)        TONE: High,  Lt>Rt  PROLAPSE: None detected in supine today  TODAY'S TREATMENT:  DATE:  11/13/23  EVAL  Neuromuscular re-education: Down training Diaphragmatic breathing Cat cow Child's pose Happy baby butterfly   PATIENT EDUCATION:  Education details: See above Person educated: Patient Education method: Explanation, Demonstration, Tactile cues, Verbal cues, and Handouts Education comprehension: verbalized understanding  HOME EXERCISE PROGRAM: [id number]  ASSESSMENT:  CLINICAL IMPRESSION: Patient is a 58 y.o. female who was seen today for physical therapy evaluation and treatment for vaginal spasm. Exam findings notable for decreased lumbar A/ROM, abnormal posture and gait, decreased bil hip extension, decreased rib cage mobility and apical breathing pattern, abdominal distortion and diastasis, pelvic floor muscle spasm/high tone bil, tenderness throughout the pelvic floor, pelvic floor muscle weakness/decreased endurance, and poor coordination of pelvic floor muscle contraction. Signs and symptoms are most consistent with high tone pelvic floor muscle, but are likely exacerbated by overall tension and decreased lumbopelvic mobility. Initial treatment consisted of diaphragmatic breathing and down training activities; we discussed factors that contribute to pelvic floor muscle tension, such as stress and weakness. She will continue to benefit from skilled PT intervention in order to decrease vaginal spasm, normalize pelvic floor muscle tone, improve pelvic floor muscle strength and endurance, increase lumbopelvic motion, improve pain levels, and begin/progress functional strengthening program.   OBJECTIVE IMPAIRMENTS: decreased activity tolerance, decreased coordination, decreased endurance, decreased mobility, decreased strength, increased fascial restrictions, increased muscle  spasms, impaired tone, postural dysfunction, and pain.   ACTIVITY LIMITATIONS: sitting  PARTICIPATION LIMITATIONS: cleaning, laundry, interpersonal relationship, and community activity  PERSONAL FACTORS: 3+ comorbidities: medical history  are also affecting patient's functional outcome.   REHAB POTENTIAL: Good  CLINICAL DECISION MAKING: Evolving/moderate complexity  EVALUATION COMPLEXITY: Moderate   GOALS: Goals reviewed with patient? Yes  SHORT TERM GOALS: Target date: 12/11/2023   Pt will be independent with HEP.   Baseline: Goal status: INITIAL  2.  Pt will be independent with diaphragmatic breathing and down training activities in order to improve pelvic floor relaxation.  Baseline:  Goal status: INITIAL  3.  Pt will report 25% improvement in vaginal spasm.  Baseline:  Goal status: INITIAL  4.  Pt will be able to sit for 10 minutes without increase in symptoms.  Baseline:  Goal status: INITIAL  5.  Pt will begin use of pelvic floor muscle wand in order to reduce pelvic floor muscle tone.  Baseline:  Goal status: INITIAL   LONG TERM GOALS: Target date: 04/29/24  Pt will be independent with advanced HEP.   Baseline:  Goal status: INITIAL  2.  Pt will report 75% improvement in vaginal spasm.  Baseline:  Goal status: INITIAL  3.  Pt will be independent with pelvic floor muscle wand for management of pelvic floor muscle spasm.  Baseline:  Goal status: INITIAL  4.  Pt will increase pelvic floor muscle strength to 3/5. Baseline:  Goal status: INITIAL  5.  Pt will demonstrate 25% improvement in lumbar A/ROM. Baseline:  Goal status: INITIAL  6.  Pt will be able to perform bil single leg stance without pelvic drop in order to demonstrate improved pelvic stability and functional core strength.  Baseline:  Goal status: INITIAL  PLAN:  PT FREQUENCY: 2x/week  PT DURATION: 6 months  PLANNED INTERVENTIONS: 97110-Therapeutic exercises, 97530- Therapeutic  activity, 97112- Neuromuscular re-education, 97535- Self Care, [id number]- Manual therapy, Dry Needling, and Biofeedback  PLAN FOR NEXT SESSION: Mobility; progress down training; pelvic floor muscle release; manual techniques to low back and bil hips.    Julio Alm, PT, DPT03/20/251:01 PM

## 2023-11-17 ENCOUNTER — Ambulatory Visit

## 2023-11-17 DIAGNOSIS — R279 Unspecified lack of coordination: Secondary | ICD-10-CM

## 2023-11-17 DIAGNOSIS — M62838 Other muscle spasm: Secondary | ICD-10-CM

## 2023-11-17 DIAGNOSIS — R293 Abnormal posture: Secondary | ICD-10-CM | POA: Diagnosis not present

## 2023-11-17 DIAGNOSIS — N942 Vaginismus: Secondary | ICD-10-CM | POA: Diagnosis not present

## 2023-11-17 DIAGNOSIS — M5459 Other low back pain: Secondary | ICD-10-CM

## 2023-11-17 DIAGNOSIS — R102 Pelvic and perineal pain: Secondary | ICD-10-CM | POA: Diagnosis not present

## 2023-11-17 DIAGNOSIS — M6281 Muscle weakness (generalized): Secondary | ICD-10-CM

## 2023-11-17 NOTE — Patient Instructions (Signed)

## 2023-11-17 NOTE — Therapy (Signed)
 OUTPATIENT PHYSICAL THERAPY FEMALE PELVIC TREATMENT    Patient Name: Melinda Charles MRN: [id number] DOB:[birth year], 58 y.o., female Today's Date: 11/17/2023  END OF SESSION:  PT End of Session - 11/17/23 0934     Visit Number 2    Date for PT Re-Evaluation 04/29/24    Authorization Type [employer]    Authorization Time Period 11/13/2023 - 01/11/2024    Authorization - Visit Number 1    Authorization - Number of Visits 5    PT Start Time 0930    PT Stop Time 1010    PT Time Calculation (min) 40 min    Activity Tolerance Patient tolerated treatment well    Behavior During Therapy [facility] for tasks assessed/performed              Past Medical History:  Diagnosis Date   Anxiety    [employer] 06/13/2020   Gastroesophageal reflux disease without esophagitis 09/27/2015   Last Assessment & Plan:  2 month history of symptoms Supplies sent to obtain stool sample for H pylori test Trial H2blocker or PPI/reflux precautions Last Assessment & Plan:  2 month history of symptoms Supplies sent to obtain stool sample for H pylori test Trial H2blocker or PPI/reflux precautions   History of Helicobacter pylori infection    Hyperthyroidism    Varicose veins of leg with pain 04/01/2014   Last Assessment & Plan:  Generalized achiness related to bilateral varicose veins.  Discussed support stockings, extremity elevation, both of which she has done in the past.  She is interested in procedures to alleviate the pain. Last Assessment & Plan:  Generalized achiness related to bilateral varicose veins.  Discussed support stockings, extremity elevation, both of which she has done in the [facility]   Past Surgical History:  Procedure Laterality Date   NO PAST SURGERIES     Patient Active Problem List   Diagnosis Date Noted   Chronic myofascial pain 10/23/2022   Graves disease 06/29/2021   Pain and swelling of right wrist 09/18/2020   Upper back pain 09/18/2020   Neck pain 07/18/2020   Hyperthyroidism  10/23/2018   Special screening for malignant neoplasms, colon 02/11/2018   History of Helicobacter pylori infection 02/11/2018   Palpitations 01/22/2018   Unsteadiness 12/30/2017   Gastroesophageal reflux disease without esophagitis 09/27/2015   Corn of foot 04/22/2014   Varicose veins of leg with pain 04/01/2014    PCP: Verlon Au, MD  REFERRING PROVIDER: Catalina Antigua, MD   REFERRING DIAG: 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   PAIN: 11/17/23 Are you having pain? Yes NPRS scale: 8/10 Pain location: Vaginal and low back/bil thighs  Pain type: spasm, cramping Pain description: intermittent   Aggravating factors: sitting Relieving factors: walking  PRECAUTIONS: None  RED FLAGS: None   WEIGHT BEARING RESTRICTIONS: No  FALLS:  Has patient fallen in last 6 months? 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  ACTIVITY LEVEL : none currently  PLOF: Independent  PATIENT GOALS: decrease pain and vaginal spasm  PERTINENT HISTORY:  GERD, hyperthyroidism, history of chronic myofascial pain Sexual abuse: No  BOWEL MOVEMENT: Pain with bowel movement: No Type of bowel movement:Frequency 1-2x/day and Strain none currently Fully empty rectum: Yes:   Leakage: No Pads: No Fiber supplement/laxative No  URINATION: Pain with urination: No Fully empty bladder: Yes:   Stream: Strong Urgency: Not usually Frequency: every 3 or 4  hours  Leakage: none Pads: No  INTERCOURSE: Not sexually active, but no history of pain  PREGNANCY: Vaginal deliveries 2 Tearing Yes: with first Episiotomy No C-section deliveries 0 Currently pregnant No  PROLAPSE: None   OBJECTIVE:  Note: Objective measures were completed at Evaluation unless otherwise noted. 11/13/23:  PATIENT SURVEYS:   PFIQ-7: 100  COGNITION: Overall cognitive status: Within functional limits for tasks assessed     SENSATION: Light touch: Appears intact   FUNCTIONAL TESTS:  Squat: uncomfortable, limited Charles leg stance:  Rt: pelvic drop, unsteady  Lt: pelvic drop, unsteady Curl-up test: abdominal distortion throughout   GAIT: Assistive device utilized: None Comments: rigid trunk, decreased rotation   POSTURE: forward head, increased lumbar lordosis, decreased thoracic kyphosis, and anterior pelvic tilt   LUMBARAROM/PROM:  A/PROM A/PROM  eval  Flexion 80  Extension Does not make it neutral   Right lateral flexion 50  Left lateral flexion 50  Right rotation 25  Left rotation 25   (Blank rows = not tested)   PALPATION:   General: spasm in bil lumbar paraspinals and glutes   Pelvic Alignment: WNL  Abdominal: decreased rib cage mobility, apical breathing pattern                External Perineal Exam: dryness, discoloration, labial fusion, phimosis                             Internal Pelvic Floor: dry, tender throughout, significant tension Lt>Rt  Patient confirms identification and approves PT to assess internal pelvic floor and treatment Yes  PELVIC MMT:   MMT eval  Vaginal 1/5, 4 repeat contractions, 2 second hold, very difficult time with relaxation, decreased ROM over the course of repeat contractions   Diastasis Recti 2 finger widths   (Blank rows = not tested)        TONE: High, Lt>Rt  PROLAPSE: None detected in supine today  TODAY'S TREATMENT:  DATE:  11/17/23 Manual: Trigger Point Dry Needling  Initial Treatment: Pt instructed on Dry Needling rational, procedures, and possible side effects. Pt instructed to expect mild to moderate muscle soreness later in the day and/or into the next day.  Pt instructed in methods to reduce muscle soreness. Pt instructed to continue prescribed HEP. Patient was educated on signs and symptoms of infection and other risk factors and advised to seek medical attention should they occur.  Patient verbalized understanding of these instructions and education.   Patient Verbal Consent Given: Yes Education Handout Provided: Yes Muscles Treated: Lt glutes/piriformis Electrical Stimulation Performed: No Treatment Response/Outcome: twitch response/release  Soft tissue mobilization to Lt glutes Pt provides verbal consent for internal vaginal/rectal pelvic floor exam. Internal vaginal bil pelvic floor muscle release, Lt>Rt Neuromuscular re-education: Diaphragmatic breathing for improved pelvic floor muscle release/relaxation Exercises: Lower trunk rotation 2 x 10 Charles knee to chest 5x bil Butterfly 10 breaths   11/13/23  EVAL  Neuromuscular re-education: Down training Diaphragmatic breathing Cat cow Child's pose Happy baby butterfly   PATIENT EDUCATION:  Education details: See above Person educated: Patient Education method: Explanation, Demonstration, Tactile cues, Verbal cues, and Handouts Education comprehension: verbalized understanding  HOME EXERCISE PROGRAM: [id number]  ASSESSMENT:  CLINICAL IMPRESSION: Due to spasm vaginally and in Lt hip, we performed dry needling to Lt glutes that had reported referral pain into vagina where she feels spasm. Wre followed this by internal vaginal release to Lt pelvic floor muscles. She initially did very well with this, but started having reduced tolerance; therefore we stopped and  worked on mobility activities. We discussed and practiced diaphragmatic breathing with multimodal cues for improved awareness of relaxation/clenching. Overall good tolerance for treatment today. She will continue to benefit from skilled PT intervention in order to decrease vaginal spasm, normalize pelvic floor muscle tone, improve pelvic floor muscle strength and endurance, increase lumbopelvic motion, improve pain levels, and begin/progress functional strengthening program.   OBJECTIVE IMPAIRMENTS: decreased activity tolerance, decreased coordination, decreased endurance, decreased mobility, decreased strength, increased fascial restrictions, increased muscle spasms, impaired tone, postural dysfunction, and pain.   ACTIVITY LIMITATIONS: sitting  PARTICIPATION LIMITATIONS: cleaning, laundry, interpersonal relationship, and community activity  PERSONAL FACTORS: 3+ comorbidities: medical history  are also affecting patient's functional outcome.   REHAB POTENTIAL: Good  CLINICAL DECISION MAKING: Evolving/moderate complexity  EVALUATION COMPLEXITY: Moderate   GOALS: Goals reviewed with patient? Yes  SHORT TERM GOALS: Target date: 12/11/2023   Pt will be independent with HEP.   Baseline: Goal status: INITIAL  2.  Pt will be independent with diaphragmatic breathing and down training activities in order to improve pelvic floor relaxation.  Baseline:  Goal status: INITIAL  3.  Pt will report 25% improvement in vaginal spasm.  Baseline:  Goal status: INITIAL  4.  Pt will be able to sit for 10 minutes without increase in symptoms.  Baseline:  Goal status: INITIAL  5.  Pt will begin use of pelvic floor muscle wand in order to reduce pelvic floor muscle tone.  Baseline:  Goal status: INITIAL   LONG TERM GOALS: Target date: 04/29/24  Pt will be independent with advanced HEP.   Baseline:  Goal status: INITIAL  2.  Pt will report 75% improvement in vaginal spasm.  Baseline:  Goal  status: INITIAL  3.  Pt will be independent with pelvic floor muscle wand for management of pelvic floor muscle spasm.  Baseline:  Goal status: INITIAL  4.  Pt will increase pelvic floor muscle strength  to 3/5. Baseline:  Goal status: INITIAL  5.  Pt will demonstrate 25% improvement in lumbar A/ROM. Baseline:  Goal status: INITIAL  6.  Pt will be able to perform bil Charles leg stance without pelvic drop in order to demonstrate improved pelvic stability and functional core strength.  Baseline:  Goal status: INITIAL  PLAN:  PT FREQUENCY: 2x/week  PT DURATION: 6 months  PLANNED INTERVENTIONS: 97110-Therapeutic exercises, 97530- Therapeutic activity, 97112- Neuromuscular re-education, 97535- Self Care, 28413- Manual therapy, Dry Needling, and Biofeedback  PLAN FOR NEXT SESSION: Mobility; progress down training; pelvic floor muscle release; manual techniques to low back and bil hips.    Julio Alm, PT, DPT03/24/2510:14 AM

## 2023-11-20 ENCOUNTER — Ambulatory Visit: Payer: Medicaid Other

## 2023-11-20 DIAGNOSIS — R279 Unspecified lack of coordination: Secondary | ICD-10-CM

## 2023-11-20 DIAGNOSIS — R102 Pelvic and perineal pain: Secondary | ICD-10-CM | POA: Diagnosis not present

## 2023-11-20 DIAGNOSIS — M6281 Muscle weakness (generalized): Secondary | ICD-10-CM

## 2023-11-20 DIAGNOSIS — M5459 Other low back pain: Secondary | ICD-10-CM

## 2023-11-20 DIAGNOSIS — R293 Abnormal posture: Secondary | ICD-10-CM

## 2023-11-20 DIAGNOSIS — M62838 Other muscle spasm: Secondary | ICD-10-CM | POA: Diagnosis not present

## 2023-11-20 DIAGNOSIS — N942 Vaginismus: Secondary | ICD-10-CM | POA: Diagnosis not present

## 2023-11-20 NOTE — Therapy (Signed)
 OUTPATIENT PHYSICAL THERAPY FEMALE PELVIC TREATMENT    Patient Name: Melinda Charles MRN: [id number] DOB:10-19-1965, 58 y.o., female Today's Date: 11/20/2023  END OF SESSION:  PT End of Session - 11/20/23 1102     Visit Number 3    Date for PT Re-Evaluation 04/29/24    Authorization Type [employer]    Authorization Time Period 11/13/2023 - 01/11/2024    Authorization - Visit Number 2    Authorization - Number of Visits 5    PT Start Time 1100    PT Stop Time 1140    PT Time Calculation (min) 40 min    Activity Tolerance Patient tolerated treatment well    Behavior During Therapy [facility] for tasks assessed/performed               Past Medical History:  Diagnosis Date   Anxiety    [employer] 06/13/2020   Gastroesophageal reflux disease without esophagitis 09/27/2015   Last Assessment & Plan:  2 month history of symptoms Supplies sent to obtain stool sample for H pylori test Trial H2blocker or PPI/reflux precautions Last Assessment & Plan:  2 month history of symptoms Supplies sent to obtain stool sample for H pylori test Trial H2blocker or PPI/reflux precautions   History of Helicobacter pylori infection    Hyperthyroidism    Varicose veins of leg with pain 04/01/2014   Last Assessment & Plan:  Generalized achiness related to bilateral varicose veins.  Discussed support stockings, extremity elevation, both of which she has done in the past.  She is interested in procedures to alleviate the pain. Last Assessment & Plan:  Generalized achiness related to bilateral varicose veins.  Discussed support stockings, extremity elevation, both of which she has done in the [facility]   Past Surgical History:  Procedure Laterality Date   NO PAST SURGERIES     Patient Active Problem List   Diagnosis Date Noted   Chronic myofascial pain 10/23/2022   Graves disease 06/29/2021   Pain and swelling of right wrist 09/18/2020   Upper back pain 09/18/2020   Neck pain 07/18/2020   Hyperthyroidism  10/23/2018   Special screening for malignant neoplasms, colon 02/11/2018   History of Helicobacter pylori infection 02/11/2018   Palpitations 01/22/2018   Unsteadiness 12/30/2017   Gastroesophageal reflux disease without esophagitis 09/27/2015   Corn of foot 04/22/2014   Varicose veins of leg with pain 04/01/2014    PCP: Verlon Au, MD  REFERRING PROVIDER: Catalina Antigua, MD   REFERRING DIAG: 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 She noticed that she had a much easier time getting up from seated position after dry needling last session.   PAIN: 11/20/23 Are you having pain? Yes NPRS scale: 5/10 Pain location: Vaginal and low back/bil thighs  Pain type: spasm, cramping Pain description: intermittent   Aggravating factors: sitting Relieving factors: walking  PRECAUTIONS: None  RED FLAGS: None   WEIGHT BEARING RESTRICTIONS: No  FALLS:  Has patient fallen in last 6 months? 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  ACTIVITY LEVEL : none currently  PLOF: Independent  PATIENT GOALS: decrease pain and vaginal spasm  PERTINENT HISTORY:  GERD, hyperthyroidism, history of chronic myofascial pain Sexual abuse: No  BOWEL MOVEMENT: Pain with bowel movement: No Type of bowel movement:Frequency 1-2x/day and Strain none currently Fully empty rectum: Yes:   Leakage: No Pads: No Fiber  supplement/laxative No  URINATION: Pain with urination: No Fully empty bladder: Yes:   Stream: Strong Urgency: Not usually Frequency: every 3 or 4 hours  Leakage: none Pads: No  INTERCOURSE: Not sexually active, but no history of pain  PREGNANCY: Vaginal deliveries 2 Tearing Yes: with first Episiotomy No C-section deliveries 0 Currently pregnant No  PROLAPSE: None   OBJECTIVE:  Note: Objective measures were completed at Evaluation unless otherwise noted. 11/13/23:  PATIENT SURVEYS:   PFIQ-7: 100  COGNITION: Overall cognitive status: Within functional limits for tasks assessed     SENSATION: Light touch: Appears intact   FUNCTIONAL TESTS:  Squat: uncomfortable, limited Single leg stance:  Rt: pelvic drop, unsteady  Lt: pelvic drop, unsteady Curl-up test: abdominal distortion throughout   GAIT: Assistive device utilized: None Comments: rigid trunk, decreased rotation   POSTURE: forward head, increased lumbar lordosis, decreased thoracic kyphosis, and anterior pelvic tilt   LUMBARAROM/PROM:  A/PROM A/PROM  eval  Flexion 80  Extension Does not make it neutral   Right lateral flexion 50  Left lateral flexion 50  Right rotation 25  Left rotation 25   (Blank rows = not tested)   PALPATION:   General: spasm in bil lumbar paraspinals and glutes   Pelvic Alignment: WNL  Abdominal: decreased rib cage mobility, apical breathing pattern                External Perineal Exam: dryness, discoloration, labial fusion, phimosis                             Internal Pelvic Floor: dry, tender throughout, significant tension Lt>Rt  Patient confirms identification and approves PT to assess internal pelvic floor and treatment Yes  PELVIC MMT:   MMT eval  Vaginal 1/5, 4 repeat contractions, 2 second hold, very difficult time with relaxation, decreased ROM over the course of repeat contractions   Diastasis Recti 2 finger widths   (Blank rows = not tested)         TONE: High, Lt>Rt  PROLAPSE: None detected in supine today  TODAY'S TREATMENT:  DATE:  11/20/23 Manual: Trigger Point Dry Needling  Initial Treatment: Pt instructed on Dry Needling rational, procedures, and possible side effects. Pt instructed to expect mild to moderate muscle soreness later in the day and/or into the next day.  Pt instructed in methods to reduce muscle soreness. Pt instructed to continue prescribed HEP. Patient was educated on signs and symptoms of infection and other risk factors and advised to seek medical attention should they occur.  Patient verbalized understanding of these instructions and education.   Patient Verbal Consent Given: Yes Education Handout Provided: Yes Muscles Treated: Lt glutes/piriformis, Lt L4-S2 Electrical Stimulation Performed: No Treatment Response/Outcome: twitch response/release  Soft tissue mobilization to Lt glutes Neuromuscular re-education: Seated hip adduction ball press with transversus abdominus and pelvic floor muscle 2 x 10 Seated hip abduction red band with transversus abdominus and pelvic floor muscle 2 x 10 Seated resisted march red band with transversus abdominus and pelvic floor muscle 2 x 10 Exercises: Seated piriformis stretch 60 seconds Seated forward fold 10 breaths  Seated side bend 10 breaths bil  11/17/23 Manual: Trigger Point Dry Needling  Initial Treatment: Pt instructed on Dry Needling rational, procedures, and possible side effects. Pt instructed to expect mild to moderate muscle soreness later in the day and/or into the next day.  Pt instructed in methods to reduce muscle soreness. Pt instructed to continue prescribed HEP. Patient was educated on signs and symptoms of infection and other risk factors and advised to seek medical attention should they occur.  Patient verbalized  understanding of these instructions and education.   Patient Verbal Consent Given: Yes Education Handout Provided: Yes Muscles Treated: Lt glutes/piriformis Electrical Stimulation Performed: No Treatment Response/Outcome: twitch response/release  Soft tissue mobilization to Lt glutes Pt provides verbal consent for internal vaginal/rectal pelvic floor exam. Internal vaginal bil pelvic floor muscle release, Lt>Rt Neuromuscular re-education: Diaphragmatic breathing for improved pelvic floor muscle release/relaxation Exercises: Lower trunk rotation 2 x 10 Single knee to chest 5x bil Butterfly 10 breaths   11/13/23  EVAL  Neuromuscular re-education: Down training Diaphragmatic breathing Cat cow Child's pose Happy baby butterfly   PATIENT EDUCATION:  Education details: See above Person educated: Patient Education method: Explanation, Demonstration, Tactile cues, Verbal cues, and Handouts Education comprehension: verbalized understanding  HOME EXERCISE PROGRAM: [id number]  ASSESSMENT:  CLINICAL IMPRESSION: Pt did very well after dry needling last session with improved pelvic and Lt hip pain. However, she states that she feel like she can localize the pain to Lt SIJ. Due to this, we performed dry needling in prone into Lt lumbar paraspinals and continued dry needling to Lt glutes with notable twitch response. While needling, she reported sensation of spasm in vaginal muscles. This could be related to nerves from lumbar spine, but also as a pain response. She did well working through diaphragmatic breathing and relaxation techniques to help improve discomfort. Soft tissue mobilization performed to this area to further help release tension. Excellent tolerance to initial strengthening exercises and mobility progressions; HEP updated.She will continue to benefit from skilled PT intervention in order to decrease vaginal spasm, normalize pelvic floor muscle tone, improve pelvic floor muscle  strength and endurance, increase lumbopelvic motion, improve pain levels, and begin/progress functional strengthening program.   OBJECTIVE IMPAIRMENTS: decreased activity tolerance, decreased coordination, decreased endurance, decreased mobility, decreased strength, increased fascial restrictions, increased muscle spasms, impaired tone, postural dysfunction, and pain.   ACTIVITY LIMITATIONS: sitting  PARTICIPATION LIMITATIONS: cleaning, laundry, interpersonal relationship, and community activity  PERSONAL FACTORS: 3+ comorbidities:  medical history  are also affecting patient's functional outcome.   REHAB POTENTIAL: Good  CLINICAL DECISION MAKING: Evolving/moderate complexity  EVALUATION COMPLEXITY: Moderate   GOALS: Goals reviewed with patient? Yes  SHORT TERM GOALS: Target date: 12/11/2023   Pt will be independent with HEP.   Baseline: Goal status: INITIAL  2.  Pt will be independent with diaphragmatic breathing and down training activities in order to improve pelvic floor relaxation.  Baseline:  Goal status: INITIAL  3.  Pt will report 25% improvement in vaginal spasm.  Baseline:  Goal status: INITIAL  4.  Pt will be able to sit for 10 minutes without increase in symptoms.  Baseline:  Goal status: INITIAL  5.  Pt will begin use of pelvic floor muscle wand in order to reduce pelvic floor muscle tone.  Baseline:  Goal status: INITIAL   LONG TERM GOALS: Target date: 04/29/24  Pt will be independent with advanced HEP.   Baseline:  Goal status: INITIAL  2.  Pt will report 75% improvement in vaginal spasm.  Baseline:  Goal status: INITIAL  3.  Pt will be independent with pelvic floor muscle wand for management of pelvic floor muscle spasm.  Baseline:  Goal status: INITIAL  4.  Pt will increase pelvic floor muscle strength to 3/5. Baseline:  Goal status: INITIAL  5.  Pt will demonstrate 25% improvement in lumbar A/ROM. Baseline:  Goal status: INITIAL  6.   Pt will be able to perform bil single leg stance without pelvic drop in order to demonstrate improved pelvic stability and functional core strength.  Baseline:  Goal status: INITIAL  PLAN:  PT FREQUENCY: 2x/week  PT DURATION: 6 months  PLANNED INTERVENTIONS: 97110-Therapeutic exercises, 97530- Therapeutic activity, 97112- Neuromuscular re-education, 97535- Self Care, 16109- Manual therapy, Dry Needling, and Biofeedback  PLAN FOR NEXT SESSION: Mobility; progress down training; pelvic floor muscle release; manual techniques to low back and bil hips.    Julio Alm, PT, DPT03/27/2511:05 AM

## 2023-11-27 ENCOUNTER — Ambulatory Visit: Payer: Medicaid Other | Attending: Obstetrics and Gynecology

## 2023-11-27 DIAGNOSIS — M6281 Muscle weakness (generalized): Secondary | ICD-10-CM | POA: Diagnosis not present

## 2023-11-27 DIAGNOSIS — R279 Unspecified lack of coordination: Secondary | ICD-10-CM | POA: Insufficient documentation

## 2023-11-27 DIAGNOSIS — M62838 Other muscle spasm: Secondary | ICD-10-CM | POA: Diagnosis not present

## 2023-11-27 DIAGNOSIS — R102 Pelvic and perineal pain: Secondary | ICD-10-CM | POA: Diagnosis not present

## 2023-11-27 DIAGNOSIS — M5459 Other low back pain: Secondary | ICD-10-CM | POA: Diagnosis not present

## 2023-11-27 DIAGNOSIS — R293 Abnormal posture: Secondary | ICD-10-CM | POA: Insufficient documentation

## 2023-11-27 NOTE — Therapy (Signed)
 OUTPATIENT PHYSICAL THERAPY FEMALE PELVIC TREATMENT    Patient Name: Melinda Charles MRN: [id number] DOB:1965/09/03, 58 y.o., female Today's Date: 11/27/2023  END OF SESSION:  PT End of Session - 11/27/23 1110     Visit Number 4    Date for PT Re-Evaluation 04/29/24    Authorization Type [employer]    Authorization Time Period 11/13/2023 - 01/11/2024    Authorization - Visit Number 3    Authorization - Number of Visits 5    PT Start Time 1105    PT Stop Time 1143    PT Time Calculation (min) 38 min    Activity Tolerance Patient tolerated treatment well    Behavior During Therapy WFL for tasks assessed/performed               Past Medical History:  Diagnosis Date   Anxiety    [employer] 06/13/2020   Gastroesophageal reflux disease without esophagitis 09/27/2015   Last Assessment & Plan:  2 month history of symptoms Supplies sent to obtain stool sample for H pylori test Trial H2blocker or PPI/reflux precautions Last Assessment & Plan:  2 month history of symptoms Supplies sent to obtain stool sample for H pylori test Trial H2blocker or PPI/reflux precautions   History of Helicobacter pylori infection    Hyperthyroidism    Varicose veins of leg with pain 04/01/2014   Last Assessment & Plan:  Generalized achiness related to bilateral varicose veins.  Discussed support stockings, extremity elevation, both of which she has done in the past.  She is interested in procedures to alleviate the pain. Last Assessment & Plan:  Generalized achiness related to bilateral varicose veins.  Discussed support stockings, extremity elevation, both of which she has done in the pa   Past Surgical History:  Procedure Laterality Date   NO PAST SURGERIES     Patient Active Problem List   Diagnosis Date Noted   Chronic myofascial pain 10/23/2022   Graves disease 06/29/2021   Pain and swelling of right wrist 09/18/2020   Upper back pain 09/18/2020   Neck pain 07/18/2020   Hyperthyroidism  10/23/2018   Special screening for malignant neoplasms, colon 02/11/2018   History of Helicobacter pylori infection 02/11/2018   Palpitations 01/22/2018   Unsteadiness 12/30/2017   Gastroesophageal reflux disease without esophagitis 09/27/2015   Corn of foot 04/22/2014   Varicose veins of leg with pain 04/01/2014    PCP: Verlon Au, MD  REFERRING PROVIDER: Catalina Antigua, MD   REFERRING DIAG: 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 She has been working on exercises regularly at home.   PAIN: 11/25/23 Are you having pain? Yes NPRS scale: 8/10 Pain location: Vaginal and low back/bil thighs  Pain type: spasm, cramping Pain description: intermittent   Aggravating factors: sitting Relieving factors: walking  PRECAUTIONS: None  RED FLAGS: None   WEIGHT BEARING RESTRICTIONS: No  FALLS:  Has patient fallen in last 6 months? 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  ACTIVITY LEVEL : none currently  PLOF: Independent  PATIENT GOALS: decrease pain and vaginal spasm  PERTINENT HISTORY:  GERD, hyperthyroidism, history of chronic myofascial pain Sexual abuse: No  BOWEL MOVEMENT: Pain with bowel movement: No Type of bowel movement:Frequency 1-2x/day and Strain none currently Fully empty rectum: Yes:   Leakage: No Pads: No Fiber supplement/laxative No  URINATION: Pain with urination: No Fully empty bladder: Yes:   Stream: Strong Urgency: Not usually Frequency:  every 3 or 4 hours  Leakage: none Pads: No  INTERCOURSE: Not sexually active, but no history of pain  PREGNANCY: Vaginal deliveries 2 Tearing Yes: with first Episiotomy No C-section deliveries 0 Currently pregnant No  PROLAPSE: None   OBJECTIVE:  Note: Objective measures were completed at Evaluation unless otherwise noted. 11/13/23:  PATIENT SURVEYS:   PFIQ-7: 100  COGNITION: Overall cognitive status: Within functional limits for tasks assessed     SENSATION: Light touch: Appears intact   FUNCTIONAL TESTS:  Squat: uncomfortable, limited Single leg stance:  Rt: pelvic drop, unsteady  Lt: pelvic drop, unsteady Curl-up test: abdominal distortion throughout   GAIT: Assistive device utilized: None Comments: rigid trunk, decreased rotation   POSTURE: forward head, increased lumbar lordosis, decreased thoracic kyphosis, and anterior pelvic tilt   LUMBARAROM/PROM:  A/PROM A/PROM  eval  Flexion 80  Extension Does not make it neutral   Right lateral flexion 50  Left lateral flexion 50  Right rotation 25  Left rotation 25   (Blank rows = not tested)   PALPATION:   General: spasm in bil lumbar paraspinals and glutes   Pelvic Alignment: WNL  Abdominal: decreased rib cage mobility, apical breathing pattern                External Perineal Exam: dryness, discoloration, labial fusion, phimosis                             Internal Pelvic Floor: dry, tender throughout, significant tension Lt>Rt  Patient confirms identification and approves PT to assess internal pelvic floor and treatment Yes  PELVIC MMT:   MMT eval  Vaginal 1/5, 4 repeat contractions, 2 second hold, very difficult time with relaxation, decreased ROM over the course of repeat contractions   Diastasis Recti 2 finger widths   (Blank rows = not tested)        TONE: High, Lt>Rt  PROLAPSE: None detected in supine today  TODAY'S TREATMENT:  DATE:  11/25/23 Manual: Pt provides verbal consent for internal vaginal/rectal pelvic floor exam. Internal pelvic floor muscle release vaginally in supine bil levator ani Soft tissue mobilization to bil lumbar paraspinals in prone  Neuromuscular re-education: Diaphragmatic breathing  Pelvic floor muscle relaxation cuing in reverse kegel  Superficial pelvic floor muscle desensitization    11/20/23 Manual: Trigger Point Dry Needling  Initial Treatment: Pt instructed on Dry Needling rational, procedures, and possible side effects. Pt instructed to expect mild to moderate muscle soreness later in the day and/or into the next day.  Pt instructed in methods to reduce muscle soreness. Pt instructed to continue prescribed HEP. Patient was educated on signs and symptoms of infection and other risk factors and advised to seek medical attention should they occur.  Patient verbalized understanding of these instructions and education.   Patient Verbal Consent Given: Yes Education Handout Provided: Yes Muscles Treated: Lt glutes/piriformis, Lt L4-S2 Electrical Stimulation Performed: No Treatment Response/Outcome: twitch response/release  Soft tissue mobilization to Lt glutes Neuromuscular re-education: Seated hip adduction ball press with transversus abdominus and pelvic floor muscle 2 x 10 Seated hip abduction red band with transversus abdominus and pelvic floor muscle 2 x 10 Seated resisted march red band with transversus abdominus and pelvic floor muscle 2 x 10 Exercises: Seated piriformis stretch 60 seconds Seated forward fold 10 breaths  Seated side bend 10 breaths bil  11/17/23 Manual: Trigger Point Dry Needling  Initial Treatment: Pt instructed on Dry Needling rational, procedures, and possible side effects. Pt instructed to expect mild to moderate muscle soreness later in the day and/or into the next day.   Pt instructed in methods to reduce muscle soreness. Pt instructed to continue prescribed HEP. Patient was educated on signs and symptoms of infection and other risk factors and advised to seek medical attention should they occur.  Patient verbalized understanding of these instructions and education.   Patient Verbal Consent Given: Yes Education Handout Provided: Yes Muscles Treated: Lt glutes/piriformis Electrical Stimulation Performed: No Treatment Response/Outcome: twitch response/release  Soft tissue mobilization to Lt glutes Pt provides verbal consent for internal vaginal/rectal pelvic floor exam. Internal vaginal bil pelvic floor muscle release, Lt>Rt Neuromuscular re-education: Diaphragmatic breathing for improved pelvic floor muscle release/relaxation Exercises: Lower trunk rotation 2 x 10 Single knee to chest 5x bil Butterfly 10 breaths    PATIENT EDUCATION:  Education details: See above Person educated: Patient Education method: Programmer, multimedia, Demonstration, Tactile cues, Verbal cues, and Handouts Education comprehension: verbalized understanding  HOME EXERCISE PROGRAM: [id number]  ASSESSMENT:  CLINICAL IMPRESSION: Pt states that she is not seeing any progress with vaginal spasm or pain at this point in time. We returned to internal vaginal pelvic floor muscle release and desensitization today with diaphragmatic breathing and down training concepts to help improve overall relaxation. She continues to have notable spasm and restriction in the pelvic floor muscles. She also has a very hard time with relaxation and allows her breathing to speed up, appearing to become more anxious. We discussed at length how to slow breathing and breathe into abdomen to help with pain management and relaxation. Even with this, tolerance was low and we stopped to work on soft tissue mobilization to lumbar paraspinals. She did report improved symptoms at end of session, 5/10 pain. She will  continue to benefit from skilled PT intervention in order to decrease vaginal spasm, normalize pelvic floor muscle tone, improve pelvic floor muscle strength and endurance, increase lumbopelvic motion, improve pain levels, and begin/progress functional strengthening program.  OBJECTIVE IMPAIRMENTS: decreased activity tolerance, decreased coordination, decreased endurance, decreased mobility, decreased strength, increased fascial restrictions, increased muscle spasms, impaired tone, postural dysfunction, and pain.   ACTIVITY LIMITATIONS: sitting  PARTICIPATION LIMITATIONS: cleaning, laundry, interpersonal relationship, and community activity  PERSONAL FACTORS: 3+ comorbidities: medical history  are also affecting patient's functional outcome.   REHAB POTENTIAL: Good  CLINICAL DECISION MAKING: Evolving/moderate complexity  EVALUATION COMPLEXITY: Moderate   GOALS: Goals reviewed with patient? Yes  SHORT TERM GOALS: Target date: 12/11/2023   Pt will be independent with HEP.   Baseline: Goal status: INITIAL  2.  Pt will be independent with diaphragmatic breathing and down training activities in order to improve pelvic floor relaxation.  Baseline:  Goal status: INITIAL  3.  Pt will report 25% improvement in vaginal spasm.  Baseline:  Goal status: INITIAL  4.  Pt will be able to sit for 10 minutes without increase in symptoms.  Baseline:  Goal status: INITIAL  5.  Pt will begin use of pelvic floor muscle wand in order to reduce pelvic floor muscle tone.  Baseline:  Goal status: INITIAL   LONG TERM GOALS: Target date: 04/29/24  Pt will be independent with advanced HEP.   Baseline:  Goal status: INITIAL  2.  Pt will report 75% improvement in vaginal spasm.  Baseline:  Goal status: INITIAL  3.  Pt will be independent with pelvic floor muscle wand for management of pelvic floor muscle spasm.  Baseline:  Goal status: INITIAL  4.  Pt will increase pelvic floor muscle  strength to 3/5. Baseline:  Goal status: INITIAL  5.  Pt will demonstrate 25% improvement in lumbar A/ROM. Baseline:  Goal status: INITIAL  6.  Pt will be able to perform bil single leg stance without pelvic drop in order to demonstrate improved pelvic stability and functional core strength.  Baseline:  Goal status: INITIAL  PLAN:  PT FREQUENCY: 2x/week  PT DURATION: 6 months  PLANNED INTERVENTIONS: 97110-Therapeutic exercises, 97530- Therapeutic activity, 97112- Neuromuscular re-education, 97535- Self Care, 86578- Manual therapy, Dry Needling, and Biofeedback  PLAN FOR NEXT SESSION: Mobility; progress down training; pelvic floor muscle release; manual techniques to low back and bil hips.    Julio Alm, PT, DPT04/10/2509:42 AM

## 2023-12-04 ENCOUNTER — Ambulatory Visit: Payer: Medicaid Other

## 2023-12-08 ENCOUNTER — Ambulatory Visit

## 2023-12-08 ENCOUNTER — Telehealth: Payer: Self-pay

## 2023-12-08 NOTE — Telephone Encounter (Signed)
 Called and spoke with patient after no-show. She did not realize she had an appointment. We rescheduled for later in the week.

## 2023-12-09 NOTE — Telephone Encounter (Signed)
 NA

## 2023-12-10 ENCOUNTER — Ambulatory Visit: Payer: Self-pay

## 2023-12-10 DIAGNOSIS — R279 Unspecified lack of coordination: Secondary | ICD-10-CM | POA: Diagnosis not present

## 2023-12-10 DIAGNOSIS — M6281 Muscle weakness (generalized): Secondary | ICD-10-CM | POA: Diagnosis not present

## 2023-12-10 DIAGNOSIS — M5459 Other low back pain: Secondary | ICD-10-CM

## 2023-12-10 DIAGNOSIS — M62838 Other muscle spasm: Secondary | ICD-10-CM

## 2023-12-10 DIAGNOSIS — R293 Abnormal posture: Secondary | ICD-10-CM

## 2023-12-10 DIAGNOSIS — R102 Pelvic and perineal pain: Secondary | ICD-10-CM

## 2023-12-10 NOTE — Therapy (Signed)
 OUTPATIENT PHYSICAL THERAPY FEMALE PELVIC TREATMENT    Patient Name: Melinda Charles MRN: [id number] DOB:[birth year], 58 y.o., female Today's Date: 12/10/2023  END OF SESSION:  PT End of Session - 12/10/23 1402     Visit Number 5    Date for PT Re-Evaluation 04/29/24    Authorization Type [employer]    Authorization Time Period 11/13/2023 - 01/11/2024    Authorization - Visit Number 5   eval counted as first treatment   Authorization - Number of Visits 5    PT Start Time 1400    PT Stop Time 1440    PT Time Calculation (min) 40 min    Activity Tolerance Patient tolerated treatment well    Behavior During Therapy [facility] for tasks assessed/performed               Past Medical History:  Diagnosis Date   Anxiety    [employer] 06/13/2020   Gastroesophageal reflux disease without esophagitis 09/27/2015   Last Assessment & Plan:  2 month history of symptoms Supplies sent to obtain stool sample for H pylori test Trial H2blocker or PPI/reflux precautions Last Assessment & Plan:  2 month history of symptoms Supplies sent to obtain stool sample for H pylori test Trial H2blocker or PPI/reflux precautions   History of Helicobacter pylori infection    Hyperthyroidism    Varicose veins of leg with pain 04/01/2014   Last Assessment & Plan:  Generalized achiness related to bilateral varicose veins.  Discussed support stockings, extremity elevation, both of which she has done in the past.  She is interested in procedures to alleviate the pain. Last Assessment & Plan:  Generalized achiness related to bilateral varicose veins.  Discussed support stockings, extremity elevation, both of which she has done in the pa   Past Surgical History:  Procedure Laterality Date   NO PAST SURGERIES     Patient Active Problem List   Diagnosis Date Noted   Chronic myofascial pain 10/23/2022   Graves disease 06/29/2021   Pain and swelling of right wrist 09/18/2020   Upper back pain 09/18/2020   Neck pain  07/18/2020   Hyperthyroidism 10/23/2018   Special screening for malignant neoplasms, colon 02/11/2018   History of Helicobacter pylori infection 02/11/2018   Palpitations 01/22/2018   Unsteadiness 12/30/2017   Gastroesophageal reflux disease without esophagitis 09/27/2015   Corn of foot 04/22/2014   Varicose veins of leg with pain 04/01/2014    PCP: Jacqulyne Maxim, MD  REFERRING PROVIDER: Verlyn Goad, MD   REFERRING DIAG: N94.2 (ICD-10-CM) - Vaginismus  THERAPY DIAG:  Other muscle spasm  Unspecified lack of coordination  Muscle weakness (generalized)  Abnormal posture  Pelvic pain  Other low back pain  Rationale for Evaluation and Treatment: Rehabilitation  ONSET DATE: 4 months  SUBJECTIVE:  SUBJECTIVE STATEMENT: Pt states that she has been working on exercises but she feels like the vaginal spasms are not letting up yet. She has been working on breathing to help deal with pain. She feels like spasms are still worse when she is sitting.   PAIN: 12/10/23 Are you having pain? Yes NPRS scale: 7-8/10 Pain location: Vaginal and low back/bil thighs  Pain type: spasm, cramping Pain description: intermittent   Aggravating factors: sitting Relieving factors: walking  PRECAUTIONS: None  RED FLAGS: None   WEIGHT BEARING RESTRICTIONS: No  FALLS:  Has patient fallen in last 6 months? 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  ACTIVITY LEVEL : none currently  PLOF: Independent  PATIENT GOALS: decrease pain and vaginal spasm  PERTINENT HISTORY:  GERD, hyperthyroidism, history of chronic myofascial pain Sexual abuse: No  BOWEL MOVEMENT: Pain with bowel movement: No Type of bowel movement:Frequency 1-2x/day and Strain none currently Fully empty rectum: Yes:    Leakage: No Pads: No Fiber supplement/laxative No  URINATION: Pain with urination: No Fully empty bladder: Yes:   Stream: Strong Urgency: Not usually Frequency: every 3 or 4 hours  Leakage: none Pads: No  INTERCOURSE: Not sexually active, but no history of pain  PREGNANCY: Vaginal deliveries 2 Tearing Yes: with first Episiotomy No C-section deliveries 0 Currently pregnant No  PROLAPSE: None   OBJECTIVE:  Note: Objective measures were completed at Evaluation unless otherwise noted. 12/10/23               Internal Pelvic Floor: dry, less tender throughout  Patient confirms identification and approves PT to assess internal pelvic floor and treatment Yes  PELVIC MMT:   MMT 12/10/23  Vaginal 1/5, 5 repeat contractions, 3 second hold, still having hard time with coordination of complete relaxation and full A/ROM  (Blank rows = not tested)        TONE: High, equal bilateral today  11/13/23:  PATIENT SURVEYS:   PFIQ-7: 100 (no change)  COGNITION: Overall cognitive status: Within functional limits for tasks assessed     SENSATION: Light touch: Appears intact   FUNCTIONAL TESTS:  Squat: uncomfortable, limited Single leg stance:  Rt: pelvic drop, unsteady  Lt: pelvic drop, unsteady Curl-up test: abdominal distortion throughout   GAIT: Assistive device utilized: None Comments: rigid trunk, decreased rotation   POSTURE: forward head, increased lumbar lordosis, decreased thoracic kyphosis, and anterior pelvic tilt   LUMBARAROM/PROM:  A/PROM A/PROM  eval  Flexion 80  Extension Does not make it neutral   Right lateral flexion 50  Left lateral flexion 50  Right rotation 25  Left rotation 25   (Blank rows = not tested)   PALPATION:   General: spasm in bil lumbar paraspinals and glutes   Pelvic Alignment: WNL  Abdominal: decreased rib cage mobility, apical breathing pattern                External Perineal Exam: dryness, discoloration, labial  fusion, phimosis                             Internal Pelvic Floor: dry, tender throughout, significant tension Lt>Rt  Patient confirms identification and approves PT to assess internal pelvic floor and treatment Yes  PELVIC MMT:   MMT eval  Vaginal 1/5, 4 repeat contractions, 2 second hold, very difficult time with relaxation, decreased ROM over the course of repeat contractions   Diastasis Recti 2 finger widths   (Blank rows =  not tested)        TONE: High, Lt>Rt  PROLAPSE: None detected in supine today  TODAY'S TREATMENT:                                                                                                                              DATE:  12/10/23 Manual: Pt provides verbal consent for internal vaginal/rectal pelvic floor exam. Internal pelvic floor muscle release vaginally in supine bil levator ani Soft tissue mobilization to bil lumbar paraspinals in prone  Lower abdominal soft tissue mobilization bil Neuromuscular re-education: Diaphragmatic breathing  Pelvic floor muscle relaxation cuing in reverse kegel  Superficial pelvic floor muscle desensitization  Pain neuroscience education - re-framing how we think about pain - threat is gone Exercises: Supine lower trunk rotation 2 x 10 Standing hip circles 10x bil Therapeutic activities: Returnign to the gym for short periods at a time and focusing on cardio and very light weight training   11/25/23 Manual: Pt provides verbal consent for internal vaginal/rectal pelvic floor exam. Internal pelvic floor muscle release vaginally in supine bil levator ani Soft tissue mobilization to bil lumbar paraspinals in prone  Neuromuscular re-education: Diaphragmatic breathing  Pelvic floor muscle relaxation cuing in reverse kegel  Superficial pelvic floor muscle desensitization    11/20/23 Manual: Trigger Point Dry Needling  Initial Treatment: Pt instructed on Dry Needling rational, procedures, and possible side  effects. Pt instructed to expect mild to moderate muscle soreness later in the day and/or into the next day.  Pt instructed in methods to reduce muscle soreness. Pt instructed to continue prescribed HEP. Patient was educated on signs and symptoms of infection and other risk factors and advised to seek medical attention should they occur.  Patient verbalized understanding of these instructions and education.   Patient Verbal Consent Given: Yes Education Handout Provided: Yes Muscles Treated: Lt glutes/piriformis, Lt L4-S2 Electrical Stimulation Performed: No Treatment Response/Outcome: twitch response/release  Soft tissue mobilization to Lt glutes Neuromuscular re-education: Seated hip adduction ball press with transversus abdominus and pelvic floor muscle 2 x 10 Seated hip abduction red band with transversus abdominus and pelvic floor muscle 2 x 10 Seated resisted march red band with transversus abdominus and pelvic floor muscle 2 x 10 Exercises: Seated piriformis stretch 60 seconds Seated forward fold 10 breaths  Seated side bend 10 breaths bil   PATIENT EDUCATION:  Education details: See above Person educated: Patient Education method: Programmer, multimedia, Demonstration, Actor cues, Verbal cues, and Handouts Education comprehension: verbalized understanding  HOME EXERCISE PROGRAM: [id number]  ASSESSMENT:  CLINICAL IMPRESSION: In patient's first few visits, she has seen no progress in pain and vaginal spasm. She reports that she has been working on relaxation breathing and exercises at home. Believe she is seeing slower progress than anticipated due to high anxiety levels and prolonged sitting that she has to do with school. We performed a lot of education on pain science and she was encouraged to re-frame how she  is thinking about pain with positive thinking and understanding that threat is gone. Believe returning to the gym to increase cardio and very light weight training will be good  for her physically and mentally. We returned to internal pelvic floor muscle desensitization and muscular release with improved tolerance compared to last session; she was able to perform diaphragmatic breathing to help relax with less gripping and anxiety. We progressed mobility exercises as well with no increase in pain. Since she also grips abdominals, we performed soft tissue mobilization to bil lower quadrant with excellent improvements in relaxation; she reported feeling some of the first relief she has had at end of this session. She will continue to benefit from skilled PT intervention in order to decrease vaginal spasm, normalize pelvic floor muscle tone, improve pelvic floor muscle strength and endurance, increase lumbopelvic motion, improve pain levels, and begin/progress functional strengthening program.   OBJECTIVE IMPAIRMENTS: decreased activity tolerance, decreased coordination, decreased endurance, decreased mobility, decreased strength, increased fascial restrictions, increased muscle spasms, impaired tone, postural dysfunction, and pain.   ACTIVITY LIMITATIONS: sitting  PARTICIPATION LIMITATIONS: cleaning, laundry, interpersonal relationship, and community activity  PERSONAL FACTORS: 3+ comorbidities: medical history  are also affecting patient's functional outcome.   REHAB POTENTIAL: Good  CLINICAL DECISION MAKING: Evolving/moderate complexity  EVALUATION COMPLEXITY: Moderate   GOALS: Goals reviewed with patient? Yes  SHORT TERM GOALS: Updated 12/10/23   Pt will be independent with HEP.   Baseline: Goal status: MET 12/10/23  2.  Pt will be independent with diaphragmatic breathing and down training activities in order to improve pelvic floor relaxation.  Baseline:  Goal status: IN PROGRESS 12/10/23  3.  Pt will report 25% improvement in vaginal spasm.  Baseline:  Goal status:  IN PROGRESS 12/10/23  4.  Pt will be able to sit for 10 minutes without increase in  symptoms.  Baseline:  Goal status:  IN PROGRESS 12/10/23  5.  Pt will begin use of pelvic floor muscle wand in order to reduce pelvic floor muscle tone.  Baseline:  Goal status:  IN PROGRESS 12/10/23   LONG TERM GOALS: Target date: 04/29/24  Pt will be independent with advanced HEP.   Baseline:  Goal status:  IN PROGRESS 12/10/23  2.  Pt will report 75% improvement in vaginal spasm.  Baseline:  Goal status:  IN PROGRESS 12/10/23  3.  Pt will be independent with pelvic floor muscle wand for management of pelvic floor muscle spasm.  Baseline:  Goal status:  IN PROGRESS 12/10/23  4.  Pt will increase pelvic floor muscle strength to 3/5. Baseline:  Goal status:  IN PROGRESS 12/10/23  5.  Pt will demonstrate 25% improvement in lumbar A/ROM. Baseline:  Goal status:  IN PROGRESS 12/10/23  6.  Pt will be able to perform bil single leg stance without pelvic drop in order to demonstrate improved pelvic stability and functional core strength.  Baseline:  Goal status:  IN PROGRESS 12/10/23  PLAN:  PT FREQUENCY: 2x/week  PT DURATION: 6 months  PLANNED INTERVENTIONS: 97110-Therapeutic exercises, 97530- Therapeutic activity, 97112- Neuromuscular re-education, 97535- Self Care, [id number]- Manual therapy, Dry Needling, and Biofeedback  PLAN FOR NEXT SESSION: Mobility; progress down training; pelvic floor muscle release; manual techniques to low back and bil hips.    Julio Alm, PT, DPT04/16/252:45 PM

## 2023-12-15 ENCOUNTER — Ambulatory Visit

## 2023-12-15 DIAGNOSIS — M6281 Muscle weakness (generalized): Secondary | ICD-10-CM

## 2023-12-15 DIAGNOSIS — R279 Unspecified lack of coordination: Secondary | ICD-10-CM | POA: Diagnosis not present

## 2023-12-15 DIAGNOSIS — M5459 Other low back pain: Secondary | ICD-10-CM

## 2023-12-15 DIAGNOSIS — R293 Abnormal posture: Secondary | ICD-10-CM

## 2023-12-15 DIAGNOSIS — M62838 Other muscle spasm: Secondary | ICD-10-CM | POA: Diagnosis not present

## 2023-12-15 DIAGNOSIS — R102 Pelvic and perineal pain: Secondary | ICD-10-CM

## 2023-12-15 NOTE — Therapy (Signed)
 OUTPATIENT PHYSICAL THERAPY FEMALE PELVIC TREATMENT    Patient Name: Melinda Charles MRN: [id number] DOB:[birth year], 58 y.o., female Today's Date: 12/15/2023  END OF SESSION:  PT End of Session - 12/15/23 1402     Visit Number 6    Authorization Type [employer]    Authorization Time Period 12/15/2023 - 01/13/2024    Authorization - Visit Number 1    Authorization - Number of Visits 4    PT Start Time 1400    PT Stop Time 1440    PT Time Calculation (min) 40 min               Past Medical History:  Diagnosis Date   Anxiety    [employer] 06/13/2020   Gastroesophageal reflux disease without esophagitis 09/27/2015   Last Assessment & Plan:  2 month history of symptoms Supplies sent to obtain stool sample for H pylori test Trial H2blocker or PPI/reflux precautions Last Assessment & Plan:  2 month history of symptoms Supplies sent to obtain stool sample for H pylori test Trial H2blocker or PPI/reflux precautions   History of Helicobacter pylori infection    Hyperthyroidism    Varicose veins of leg with pain 04/01/2014   Last Assessment & Plan:  Generalized achiness related to bilateral varicose veins.  Discussed support stockings, extremity elevation, both of which she has done in the past.  She is interested in procedures to alleviate the pain. Last Assessment & Plan:  Generalized achiness related to bilateral varicose veins.  Discussed support stockings, extremity elevation, both of which she has done in the [facility]   Past Surgical History:  Procedure Laterality Date   NO PAST SURGERIES     Patient Active Problem List   Diagnosis Date Noted   Chronic myofascial pain 10/23/2022   Graves disease 06/29/2021   Pain and swelling of right wrist 09/18/2020   Upper back pain 09/18/2020   Neck pain 07/18/2020   Hyperthyroidism 10/23/2018   Special screening for malignant neoplasms, colon 02/11/2018   History of Helicobacter pylori infection 02/11/2018   Palpitations 01/22/2018    Unsteadiness 12/30/2017   Gastroesophageal reflux disease without esophagitis 09/27/2015   Corn of foot 04/22/2014   Varicose veins of leg with pain 04/01/2014    PCP: Jacqulyne Maxim, MD  REFERRING PROVIDER: Verlyn Goad, MD   REFERRING DIAG: 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 She is taking it again today and feels much better. She states that   PAIN: 12/15/23 Are you having pain? Yes NPRS scale: 7-8/10 Pain location: Vaginal and low back/bil thighs  Pain type: spasm, cramping Pain description: intermittent   Aggravating factors: sitting Relieving factors: walking  PRECAUTIONS: None  RED FLAGS: None   WEIGHT BEARING RESTRICTIONS: No  FALLS:  Has patient fallen in last 6 months? 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  ACTIVITY LEVEL : none currently  PLOF: Independent  PATIENT GOALS: decrease pain and vaginal spasm  PERTINENT HISTORY:  GERD, hyperthyroidism, history of chronic myofascial pain Sexual abuse: No  BOWEL MOVEMENT: Pain with bowel movement: No Type of bowel movement:Frequency 1-2x/day and Strain none currently Fully empty rectum: Yes:   Leakage: No Pads: No Fiber supplement/laxative No  URINATION: Pain with urination: No Fully empty bladder: Yes:   Stream: Strong Urgency: Not usually Frequency: every 3 or 4 hours  Leakage:  none Pads: No  INTERCOURSE: Not sexually active, but no history of pain  PREGNANCY: Vaginal deliveries 2 Tearing Yes: with first Episiotomy No C-section deliveries 0 Currently pregnant No  PROLAPSE: None   OBJECTIVE:  Note: Objective measures were completed at Evaluation unless otherwise noted. 12/10/23               Internal Pelvic Floor: dry, less tender throughout  Patient confirms identification and approves PT to assess internal pelvic floor and treatment Yes  PELVIC MMT:   MMT 12/10/23  Vaginal 1/5, 5 repeat contractions, 3 second hold, still having hard time with coordination of complete relaxation and full A/ROM  (Blank rows = not tested)        TONE: High, equal bilateral today  11/13/23:  PATIENT SURVEYS:   PFIQ-7: 100 (no change)  COGNITION: Overall cognitive status: Within functional limits for tasks assessed     SENSATION: Light touch: Appears intact   FUNCTIONAL TESTS:  Squat: uncomfortable, limited Single leg stance:  Rt: pelvic drop, unsteady  Lt: pelvic drop, unsteady Curl-up test: abdominal distortion throughout   GAIT: Assistive device utilized: None Comments: rigid trunk, decreased rotation   POSTURE: forward head, increased lumbar lordosis, decreased thoracic kyphosis, and anterior pelvic tilt   LUMBARAROM/PROM:  A/PROM A/PROM  eval  Flexion 80  Extension Does not make it neutral   Right lateral flexion 50  Left lateral flexion 50  Right rotation 25  Left rotation 25   (Blank rows = not tested)   PALPATION:   General: spasm in bil lumbar paraspinals and glutes   Pelvic Alignment: WNL  Abdominal: decreased rib cage mobility, apical breathing pattern                External Perineal Exam: dryness, discoloration, labial fusion, phimosis                             Internal Pelvic Floor: dry, tender throughout, significant tension Lt>Rt  Patient confirms identification and approves PT to assess internal pelvic floor  and treatment Yes  PELVIC MMT:   MMT eval  Vaginal 1/5, 4 repeat contractions, 2 second hold, very difficult time with relaxation, decreased ROM over the course of repeat contractions   Diastasis Recti 2 finger widths   (Blank rows = not tested)  TONE: High, Lt>Rt  PROLAPSE: None detected in supine today  TODAY'S TREATMENT:                                                                                                                              DATE:  12/15/23 Manual: Pt provides verbal consent for internal vaginal/rectal pelvic floor exam. Supine internal vaginal pelvic floor muscle release to bil levator ani and superficial pelvic floor muscles Rt side lying soft tissue mobilization to Lt glutes, lumbar paraspinals, obliques, and quadratus lumborum  Neuromuscular re-education: Diaphragmatic breathing for improved pelvic floor muscle relaxation and lengthening using multimodal cues to help improve  Exercises: Swiss ball Circles 10x bil Lateral glides 10x bil Pelvic tilts 2 x 10 Cat cow 10x Tail wags 10x Lateral side bend over peanut 2 min bil   12/10/23 Manual: Pt provides verbal consent for internal vaginal/rectal pelvic floor exam. Internal pelvic floor muscle release vaginally in supine bil levator ani Soft tissue mobilization to bil lumbar paraspinals in prone  Lower abdominal soft tissue mobilization bil Neuromuscular re-education: Diaphragmatic breathing  Pelvic floor muscle relaxation cuing in reverse kegel  Superficial pelvic floor muscle desensitization  Pain neuroscience education - re-framing how we think about pain - threat is gone Exercises: Supine lower trunk rotation 2 x 10 Standing hip circles 10x bil Therapeutic activities: Returnign to the gym for short periods at a time and focusing on cardio and very light weight training   11/25/23 Manual: Pt provides verbal consent for internal vaginal/rectal pelvic floor exam. Internal pelvic floor  muscle release vaginally in supine bil levator ani Soft tissue mobilization to bil lumbar paraspinals in prone  Neuromuscular re-education: Diaphragmatic breathing  Pelvic floor muscle relaxation cuing in reverse kegel  Superficial pelvic floor muscle desensitization     PATIENT EDUCATION:  Education details: See above Person educated: Patient Education method: Explanation, Demonstration, Tactile cues, Verbal cues, and Handouts Education comprehension: verbalized understanding  HOME EXERCISE PROGRAM: [id number]  ASSESSMENT:  CLINICAL IMPRESSION: Pt not seeing any progress with pain or symptoms at this point. We reviewed HEP and believe she has not been perfomring all exercises on regular basis due to being unfamiliar. We reviewed this and discussed the importance of working through these on regular basis to improve movement in low back and pelvis as well as focusing on breathing exercises that will help her relax. Significant trigger points/restriction present in Rt obliques/quadratus lumborum that sent pain into low back and pelvis. She had good improvements in Lt pelvic floor muscle restriction, but more did continue on the Rt; she did show some improvements in tolerance of pelvic floor muscle release bil. She did well with all mobility exercises and we discussed the importance of not just going through exercises, but doing them well with good intention and breathing. She will continue to benefit from skilled PT intervention in order to decrease vaginal spasm, normalize pelvic floor muscle tone, improve pelvic floor muscle strength and endurance, increase  lumbopelvic motion, improve pain levels, and begin/progress functional strengthening program.   OBJECTIVE IMPAIRMENTS: decreased activity tolerance, decreased coordination, decreased endurance, decreased mobility, decreased strength, increased fascial restrictions, increased muscle spasms, impaired tone, postural dysfunction, and pain.    ACTIVITY LIMITATIONS: sitting  PARTICIPATION LIMITATIONS: cleaning, laundry, interpersonal relationship, and community activity  PERSONAL FACTORS: 3+ comorbidities: medical history  are also affecting patient's functional outcome.   REHAB POTENTIAL: Good  CLINICAL DECISION MAKING: Evolving/moderate complexity  EVALUATION COMPLEXITY: Moderate   GOALS: Goals reviewed with patient? Yes  SHORT TERM GOALS: Updated 12/10/23   Pt will be independent with HEP.   Baseline: Goal status: MET 12/10/23  2.  Pt will be independent with diaphragmatic breathing and down training activities in order to improve pelvic floor relaxation.  Baseline:  Goal status: IN PROGRESS 12/10/23  3.  Pt will report 25% improvement in vaginal spasm.  Baseline:  Goal status:  IN PROGRESS 12/10/23  4.  Pt will be able to sit for 10 minutes without increase in symptoms.  Baseline:  Goal status:  IN PROGRESS 12/10/23  5.  Pt will begin use of pelvic floor muscle wand in order to reduce pelvic floor muscle tone.  Baseline:  Goal status:  IN PROGRESS 12/10/23   LONG TERM GOALS: Target date: 04/29/24  Pt will be independent with advanced HEP.   Baseline:  Goal status:  IN PROGRESS 12/10/23  2.  Pt will report 75% improvement in vaginal spasm.  Baseline:  Goal status:  IN PROGRESS 12/10/23  3.  Pt will be independent with pelvic floor muscle wand for management of pelvic floor muscle spasm.  Baseline:  Goal status:  IN PROGRESS 12/10/23  4.  Pt will increase pelvic floor muscle strength to 3/5. Baseline:  Goal status:  IN PROGRESS 12/10/23  5.  Pt will demonstrate 25% improvement in lumbar A/ROM. Baseline:  Goal status:  IN PROGRESS 12/10/23  6.  Pt will be able to perform bil single leg stance without pelvic drop in order to demonstrate improved pelvic stability and functional core strength.  Baseline:  Goal status:  IN PROGRESS 12/10/23  PLAN:  PT FREQUENCY: 2x/week  PT DURATION: 6  months  PLANNED INTERVENTIONS: 97110-Therapeutic exercises, 97530- Therapeutic activity, 97112- Neuromuscular re-education, 97535- Self Care, 16109- Manual therapy, Dry Needling, and Biofeedback  PLAN FOR NEXT SESSION: Mobility; progress down training; pelvic floor muscle release; manual techniques to low back and bil hips.    Verlena Glenn, PT, DPT04/21/252:42 PM

## 2023-12-18 ENCOUNTER — Ambulatory Visit

## 2023-12-18 DIAGNOSIS — M6281 Muscle weakness (generalized): Secondary | ICD-10-CM

## 2023-12-18 DIAGNOSIS — M5459 Other low back pain: Secondary | ICD-10-CM

## 2023-12-18 DIAGNOSIS — R293 Abnormal posture: Secondary | ICD-10-CM

## 2023-12-18 DIAGNOSIS — R102 Pelvic and perineal pain: Secondary | ICD-10-CM

## 2023-12-18 DIAGNOSIS — R279 Unspecified lack of coordination: Secondary | ICD-10-CM | POA: Diagnosis not present

## 2023-12-18 DIAGNOSIS — M62838 Other muscle spasm: Secondary | ICD-10-CM | POA: Diagnosis not present

## 2023-12-18 NOTE — Therapy (Signed)
 OUTPATIENT PHYSICAL THERAPY FEMALE PELVIC TREATMENT    Patient Name: Melinda Charles MRN: [id number] DOB:November 06, 1965, 58 y.o., female Today's Date: 12/18/2023  END OF SESSION:  PT End of Session - 12/18/23 1544     Visit Number 7    Date for PT Re-Evaluation 04/29/24    Authorization Type [employer]    Authorization Time Period 12/15/2023 - 01/13/2024    Authorization - Visit Number 2    Authorization - Number of Visits 4    PT Start Time 1530    PT Stop Time 1610    PT Time Calculation (min) 40 min    Activity Tolerance Patient tolerated treatment well    Behavior During Therapy [facility] for tasks assessed/performed                Past Medical History:  Diagnosis Date   Anxiety    [employer] 06/13/2020   Gastroesophageal reflux disease without esophagitis 09/27/2015   Last Assessment & Plan:  2 month history of symptoms Supplies sent to obtain stool sample for H pylori test Trial H2blocker or PPI/reflux precautions Last Assessment & Plan:  2 month history of symptoms Supplies sent to obtain stool sample for H pylori test Trial H2blocker or PPI/reflux precautions   History of Helicobacter pylori infection    Hyperthyroidism    Varicose veins of leg with pain 04/01/2014   Last Assessment & Plan:  Generalized achiness related to bilateral varicose veins.  Discussed support stockings, extremity elevation, both of which she has done in the past.  She is interested in procedures to alleviate the pain. Last Assessment & Plan:  Generalized achiness related to bilateral varicose veins.  Discussed support stockings, extremity elevation, both of which she has done in the [facility]   Past Surgical History:  Procedure Laterality Date   NO PAST SURGERIES     Patient Active Problem List   Diagnosis Date Noted   Chronic myofascial pain 10/23/2022   Graves disease 06/29/2021   Pain and swelling of right wrist 09/18/2020   Upper back pain 09/18/2020   Neck pain 07/18/2020   Hyperthyroidism  10/23/2018   Special screening for malignant neoplasms, colon 02/11/2018   History of Helicobacter pylori infection 02/11/2018   Palpitations 01/22/2018   Unsteadiness 12/30/2017   Gastroesophageal reflux disease without esophagitis 09/27/2015   Corn of foot 04/22/2014   Varicose veins of leg with pain 04/01/2014    PCP: Jacqulyne Maxim, MD  REFERRING PROVIDER: Verlyn Goad, MD   REFERRING DIAG: 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 She was having a good day, but then for unknown reason she felt like her entire body was tightening.   PAIN: 12/18/23 Are you having pain? Yes NPRS scale: 7-8/10 Pain location: Vaginal and low back/bil thighs  Pain type: spasm, cramping Pain description: intermittent   Aggravating factors: sitting Relieving factors: walking  PRECAUTIONS: None  RED FLAGS: None   WEIGHT BEARING RESTRICTIONS: No  FALLS:  Has patient fallen in last 6 months? 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  ACTIVITY LEVEL : none currently  PLOF: Independent  PATIENT GOALS: decrease pain and vaginal spasm  PERTINENT HISTORY:  GERD, hyperthyroidism, history of chronic myofascial pain Sexual abuse: No  BOWEL MOVEMENT: Pain with bowel movement: No Type of bowel movement:Frequency 1-2x/day and Strain none currently Fully empty rectum: Yes:   Leakage: No Pads: No Fiber supplement/laxative No  URINATION: Pain with urination:  No Fully empty bladder: Yes:   Stream: Strong Urgency: Not usually Frequency: every 3 or 4 hours  Leakage: none Pads: No  INTERCOURSE: Not sexually active, but no history of pain  PREGNANCY: Vaginal deliveries 2 Tearing Yes: with first Episiotomy No C-section deliveries 0 Currently pregnant No  PROLAPSE: None   OBJECTIVE:  Note: Objective measures were completed at Evaluation unless otherwise noted. 12/10/23               Internal Pelvic Floor: dry, less tender throughout  Patient confirms identification and approves PT to assess internal pelvic floor and treatment Yes  PELVIC MMT:   MMT 12/10/23  Vaginal 1/5, 5 repeat contractions, 3 second hold, still having hard time with coordination of complete relaxation and full A/ROM  (Blank rows = not tested)        TONE: High, equal bilateral today  11/13/23:  PATIENT SURVEYS:   PFIQ-7: 100 (no change)  COGNITION: Overall cognitive status: Within functional limits for tasks assessed     SENSATION: Light touch: Appears intact   FUNCTIONAL TESTS:  Squat: uncomfortable, limited Single leg stance:  Rt: pelvic drop, unsteady  Lt: pelvic drop, unsteady Curl-up test: abdominal distortion throughout   GAIT: Assistive device utilized: None Comments: rigid trunk, decreased rotation   POSTURE: forward head, increased lumbar lordosis, decreased thoracic kyphosis, and anterior pelvic tilt   LUMBARAROM/PROM:  A/PROM A/PROM  eval  Flexion 80  Extension Does not make it neutral   Right lateral flexion 50  Left lateral flexion 50  Right rotation 25  Left rotation 25   (Blank rows = not tested)   PALPATION:   General: spasm in bil lumbar paraspinals and glutes   Pelvic Alignment: WNL  Abdominal: decreased rib cage mobility, apical breathing pattern                External Perineal Exam: dryness, discoloration, labial fusion, phimosis                             Internal Pelvic Floor: dry, tender  throughout, significant tension Lt>Rt  Patient confirms identification and approves PT to assess internal pelvic floor and treatment Yes  PELVIC MMT:   MMT eval  Vaginal 1/5, 4 repeat contractions, 2 second hold, very difficult time with relaxation, decreased ROM over the course of repeat contractions   Diastasis Recti 2 finger widths   (Blank rows = not tested)        TONE: High,  Lt>Rt  PROLAPSE: None detected in supine today  TODAY'S TREATMENT:                                                                                                                              DATE:  12/18/23 Manual: Pt provides verbal consent for internal vaginal/rectal pelvic floor exam. Supine internal vaginal pelvic floor muscle release to bil levator ani and superficial pelvic floor muscles Prone soft tissue mobilization to bil lumbar paraspinals, glutes, lats, obliques Prone hamstring/adductor proximal soft tissue mobilization of Lt Therapeutic activities: Pt education on possible benefit of aquatic exercises Pt education on benefit of mental health therapy Pt education on discussing vaginal muscle relaxants with MD   12/15/23 Manual: Pt provides verbal consent for internal vaginal/rectal pelvic floor exam. Supine internal vaginal pelvic floor muscle release to bil levator ani and superficial pelvic floor muscles Rt side lying soft tissue mobilization to Lt glutes, lumbar paraspinals, obliques, and quadratus lumborum  Neuromuscular re-education: Diaphragmatic breathing for improved pelvic floor muscle relaxation and lengthening using multimodal cues to help improve  Exercises: Swiss ball Circles 10x bil Lateral glides 10x bil Pelvic tilts 2 x 10 Cat cow 10x Tail wags 10x Lateral side bend over peanut 2 min bil   12/10/23 Manual: Pt provides verbal consent for internal vaginal/rectal pelvic floor exam. Internal pelvic floor muscle release vaginally in supine bil levator ani Soft tissue  mobilization to bil lumbar paraspinals in prone  Lower abdominal soft tissue mobilization bil Neuromuscular re-education: Diaphragmatic breathing  Pelvic floor muscle relaxation cuing in reverse kegel  Superficial pelvic floor muscle desensitization  Pain neuroscience education - re-framing how we think about pain - threat is gone Exercises: Supine lower trunk rotation 2 x 10 Standing hip circles 10x bil Therapeutic activities: Returnign to the gym for short periods at a time and focusing on cardio and very light weight training   PATIENT EDUCATION:  Education details: See above Person educated: Patient Education method: Explanation, Demonstration, Tactile cues, Verbal cues, and Handouts Education comprehension: verbalized understanding  HOME EXERCISE PROGRAM: [id number]  ASSESSMENT:  CLINICAL IMPRESSION: Pt encouraged to talk with her PCP about referral for mental health due to psychological component to pain; pt agrees this is a good idea. Also believe that possibly trying a vaginal muscle relaxant may be helpful and we will reach out to MD about this. She is still not seeing any relief after sessions in vaginal spasms. We also discussed the benefit of getting in the pool/aquatic therapy to help with overall body relaxation. We did discuss that she has been having pain for such long period of time, it may just continue to take some time to see progress. We decided to continue manual techniques to pelvic floor muscles today; she tolerated still better than last session and had notable improvements in muscle tension. Due to this improvement, it has us  wondering what other areas are contributing to the pain since it is unchanged. Since pain  is reported to refer int obuttocks and the tops of her thighs, we performed soft tissue mobilization to these areas on the Lt with report of really palpating the pain and some good improvements. At end of session she reported the most relief she has felt,  with the difference being techniques directed to Lt proximal hamstrings/adductors. She was encouraged to perform all exercises and stretches when she is having pain to help reduce. She will continue to benefit from skilled PT intervention in order to decrease vaginal spasm, normalize pelvic floor muscle tone, improve pelvic floor muscle strength and endurance, increase lumbopelvic motion, improve pain levels, and begin/progress functional strengthening program.   OBJECTIVE IMPAIRMENTS: decreased activity tolerance, decreased coordination, decreased endurance, decreased mobility, decreased strength, increased fascial restrictions, increased muscle spasms, impaired tone, postural dysfunction, and pain.   ACTIVITY LIMITATIONS: sitting  PARTICIPATION LIMITATIONS: cleaning, laundry, interpersonal relationship, and community activity  PERSONAL FACTORS: 3+ comorbidities: medical history  are also affecting patient's functional outcome.   REHAB POTENTIAL: Good  CLINICAL DECISION MAKING: Evolving/moderate complexity  EVALUATION COMPLEXITY: Moderate   GOALS: Goals reviewed with patient? Yes  SHORT TERM GOALS: Updated 12/10/23   Pt will be independent with HEP.   Baseline: Goal status: MET 12/10/23  2.  Pt will be independent with diaphragmatic breathing and down training activities in order to improve pelvic floor relaxation.  Baseline:  Goal status: IN PROGRESS 12/10/23  3.  Pt will report 25% improvement in vaginal spasm.  Baseline:  Goal status:  IN PROGRESS 12/10/23  4.  Pt will be able to sit for 10 minutes without increase in symptoms.  Baseline:  Goal status:  IN PROGRESS 12/10/23  5.  Pt will begin use of pelvic floor muscle wand in order to reduce pelvic floor muscle tone.  Baseline:  Goal status:  IN PROGRESS 12/10/23   LONG TERM GOALS: Target date: 04/29/24  Pt will be independent with advanced HEP.   Baseline:  Goal status:  IN PROGRESS 12/10/23  2.  Pt will report 75%  improvement in vaginal spasm.  Baseline:  Goal status:  IN PROGRESS 12/10/23  3.  Pt will be independent with pelvic floor muscle wand for management of pelvic floor muscle spasm.  Baseline:  Goal status:  IN PROGRESS 12/10/23  4.  Pt will increase pelvic floor muscle strength to 3/5. Baseline:  Goal status:  IN PROGRESS 12/10/23  5.  Pt will demonstrate 25% improvement in lumbar A/ROM. Baseline:  Goal status:  IN PROGRESS 12/10/23  6.  Pt will be able to perform bil single leg stance without pelvic drop in order to demonstrate improved pelvic stability and functional core strength.  Baseline:  Goal status:  IN PROGRESS 12/10/23  PLAN:  PT FREQUENCY: 2x/week  PT DURATION: 6 months  PLANNED INTERVENTIONS: 97110-Therapeutic exercises, 97530- Therapeutic activity, 97112- Neuromuscular re-education, 97535- Self Care, 16109- Manual therapy, Dry Needling, and Biofeedback  PLAN FOR NEXT SESSION: Mobility; progress down training; pelvic floor muscle release; manual techniques to low back and bil hips.    Verlena Glenn, PT, DPT04/24/254:19 PM

## 2023-12-22 ENCOUNTER — Ambulatory Visit

## 2023-12-22 DIAGNOSIS — R293 Abnormal posture: Secondary | ICD-10-CM | POA: Diagnosis not present

## 2023-12-22 DIAGNOSIS — M62838 Other muscle spasm: Secondary | ICD-10-CM | POA: Diagnosis not present

## 2023-12-22 DIAGNOSIS — M5459 Other low back pain: Secondary | ICD-10-CM | POA: Diagnosis not present

## 2023-12-22 DIAGNOSIS — R279 Unspecified lack of coordination: Secondary | ICD-10-CM

## 2023-12-22 DIAGNOSIS — M6281 Muscle weakness (generalized): Secondary | ICD-10-CM | POA: Diagnosis not present

## 2023-12-22 DIAGNOSIS — R102 Pelvic and perineal pain: Secondary | ICD-10-CM | POA: Diagnosis not present

## 2023-12-22 NOTE — Therapy (Addendum)
 OUTPATIENT PHYSICAL THERAPY FEMALE PELVIC TREATMENT    Patient Name: Melinda Charles MRN: [id number] DOB:[birth year], 58 y.o., female Today's Date: 12/22/2023  END OF SESSION:  PT End of Session - 12/22/23 1402     Visit Number 8    Date for PT Re-Evaluation 04/29/24    Authorization Type [employer]    Authorization Time Period 12/15/2023 - 01/13/2024    Authorization - Visit Number 3    Authorization - Number of Visits 4    PT Start Time 1400    PT Stop Time 1440    PT Time Calculation (min) 40 min    Activity Tolerance Patient tolerated treatment well    Behavior During Therapy [facility] for tasks assessed/performed                Past Medical History:  Diagnosis Date   Anxiety    [employer] 06/13/2020   Gastroesophageal reflux disease without esophagitis 09/27/2015   Last Assessment & Plan:  2 month history of symptoms Supplies sent to obtain stool sample for H pylori test Trial H2blocker or PPI/reflux precautions Last Assessment & Plan:  2 month history of symptoms Supplies sent to obtain stool sample for H pylori test Trial H2blocker or PPI/reflux precautions   History of Helicobacter pylori infection    Hyperthyroidism    Varicose veins of leg with pain 04/01/2014   Last Assessment & Plan:  Generalized achiness related to bilateral varicose veins.  Discussed support stockings, extremity elevation, both of which she has done in the past.  She is interested in procedures to alleviate the pain. Last Assessment & Plan:  Generalized achiness related to bilateral varicose veins.  Discussed support stockings, extremity elevation, both of which she has done in the pa   Past Surgical History:  Procedure Laterality Date   NO PAST SURGERIES     Patient Active Problem List   Diagnosis Date Noted   Chronic myofascial pain 10/23/2022   Graves disease 06/29/2021   Pain and swelling of right wrist 09/18/2020   Upper back pain 09/18/2020   Neck pain 07/18/2020   Hyperthyroidism  10/23/2018   Special screening for malignant neoplasms, colon 02/11/2018   History of Helicobacter pylori infection 02/11/2018   Palpitations 01/22/2018   Unsteadiness 12/30/2017   Gastroesophageal reflux disease without esophagitis 09/27/2015   Corn of foot 04/22/2014   Varicose veins of leg with pain 04/01/2014    PCP: Jacqulyne Maxim, MD  REFERRING PROVIDER: Verlyn Goad, MD   REFERRING DIAG: 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   PAIN: 12/22/23 Are you having pain? Yes NPRS scale: 6/10 Pain location: Vaginal and low back/bil thighs  Pain type: spasm, cramping Pain description: intermittent   Aggravating factors: sitting Relieving factors: walking  PRECAUTIONS: None  RED FLAGS: None   WEIGHT BEARING RESTRICTIONS: No  FALLS:  Has patient fallen in last 6 months? 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  ACTIVITY LEVEL : none currently  PLOF: Independent  PATIENT GOALS: decrease pain and vaginal spasm  PERTINENT HISTORY:  GERD, hyperthyroidism, history of chronic myofascial pain Sexual abuse: No  BOWEL MOVEMENT: Pain with bowel movement: No Type of bowel movement:Frequency 1-2x/day and Strain none currently Fully empty rectum: Yes:   Leakage: No Pads: No Fiber supplement/laxative No  URINATION: Pain with urination: No Fully empty bladder: Yes:   Stream: Strong Urgency: Not  usually Frequency: every 3 or 4 hours  Leakage: none Pads: No  INTERCOURSE: Not sexually active, but no history of pain  PREGNANCY: Vaginal deliveries 2 Tearing Yes: with first Episiotomy No C-section deliveries 0 Currently pregnant No  PROLAPSE: None   OBJECTIVE:  Note: Objective measures were completed at Evaluation unless otherwise noted. 12/10/23               Internal Pelvic Floor: dry, less tender throughout  Patient confirms identification and approves PT to assess internal pelvic floor and treatment Yes  PELVIC MMT:   MMT 12/10/23  Vaginal 1/5, 5 repeat contractions, 3 second hold, still having hard time with coordination of complete relaxation and full A/ROM  (Blank rows = not tested)        TONE: High, equal bilateral today  11/13/23:  PATIENT SURVEYS:   PFIQ-7: 100 (no change)  COGNITION: Overall cognitive status: Within functional limits for tasks assessed     SENSATION: Light touch: Appears intact   FUNCTIONAL TESTS:  Squat: uncomfortable, limited Single leg stance:  Rt: pelvic drop, unsteady  Lt: pelvic drop, unsteady Curl-up test: abdominal distortion throughout   GAIT: Assistive device utilized: None Comments: rigid trunk, decreased rotation   POSTURE: forward head, increased lumbar lordosis, decreased thoracic kyphosis, and anterior pelvic tilt   LUMBARAROM/PROM:  A/PROM A/PROM  eval  Flexion 80  Extension Does not make it neutral   Right lateral flexion 50  Left lateral flexion 50  Right rotation 25  Left rotation 25   (Blank rows = not tested)   PALPATION:   General: spasm in bil lumbar paraspinals and glutes   Pelvic Alignment: WNL  Abdominal: decreased rib cage mobility, apical breathing pattern                External Perineal Exam: dryness, discoloration, labial fusion, phimosis                             Internal Pelvic Floor: dry, tender throughout, significant tension Lt>Rt  Patient confirms identification  and approves PT to assess internal pelvic floor and treatment Yes  PELVIC MMT:   MMT eval  Vaginal 1/5, 4 repeat contractions, 2 second hold, very difficult time with relaxation, decreased ROM over the course of repeat contractions   Diastasis Recti 2 finger widths   (Blank rows = not tested)        TONE: High, Lt>Rt  PROLAPSE: None detected in supine today  TODAY'S  TREATMENT:                                                                                                                              DATE:  12/22/23 Manual: Prone:  Soft tissue mobilization to Lt hamstrings and adductors  Soft tissue mobilization to Lt lumbar paraspinals, obliques, quadratus lumborum Lt side lying: Lt obturator internus trigger point release and release to surrounding ischial attachment Lt adductor release Neuromuscular re-education: Diaphragmatic breathing Supine with 10lb wt: 3 seconds x 4, 4 seconds x 4, 5 seconds x 4 Supine green band around lower rib cage: 3 seconds x 4, 4 seconds x 4, 5 seconds x 4  12/18/23 Manual: Pt provides verbal consent for internal vaginal/rectal pelvic floor exam. Supine internal vaginal pelvic floor muscle release to bil levator ani and superficial pelvic floor muscles Prone soft tissue mobilization to bil lumbar paraspinals, glutes, lats, obliques Prone hamstring/adductor proximal soft tissue mobilization of Lt Therapeutic activities: Pt education on possible benefit of aquatic exercises Pt education on benefit of mental health therapy Pt education on discussing vaginal muscle relaxants with MD   12/15/23 Manual: Pt provides verbal consent for internal vaginal/rectal pelvic floor exam. Supine internal vaginal pelvic floor muscle release to bil levator ani and superficial pelvic floor muscles Rt side lying soft tissue mobilization to Lt glutes, lumbar paraspinals, obliques, and quadratus lumborum  Neuromuscular re-education: Diaphragmatic breathing for  improved pelvic floor muscle relaxation and lengthening using multimodal cues to help improve  Exercises: Swiss ball Circles 10x bil Lateral glides 10x bil Pelvic tilts 2 x 10 Cat cow 10x Tail wags 10x Lateral side bend over peanut 2 min bil    PATIENT EDUCATION:  Education details: See above Person educated: Patient Education method: Programmer, multimedia, Demonstration, Tactile cues, Verbal cues, and Handouts Education comprehension: verbalized understanding  HOME EXERCISE PROGRAM: [email or web address]  ASSESSMENT:  CLINICAL IMPRESSION: Pt reporting some good improvements in pain overall in the last week; a lot of this is likely due to working on exercises when she is having pain. Believe the manual techniques to back performed last time were also very helpful and we focused on those today. We repeated soft tissue mobilization to low back, Lt hamstrings/adductors, and included Lt obturator internus this session. She demonstrated notable trigger points in obturator internus and surrounding area that reproduced familiar pain. Good tolerance to release and believe she was able to relax more completely than performing release to this muscle internally. She did very well with new breathing techniques to help improve pelvic floor muscle relaxation. She will continue to benefit from skilled PT intervention in order to decrease vaginal spasm, normalize pelvic floor muscle tone, improve pelvic floor muscle strength and endurance, increase lumbopelvic motion, improve pain levels, and begin/progress functional strengthening program.   OBJECTIVE IMPAIRMENTS: decreased activity tolerance, decreased coordination, decreased endurance, decreased mobility, decreased strength, increased fascial restrictions, increased muscle spasms, impaired tone, postural dysfunction, and pain.   ACTIVITY LIMITATIONS: sitting  PARTICIPATION LIMITATIONS: cleaning, laundry, interpersonal relationship, and community activity  PERSONAL  FACTORS: 3+ comorbidities: medical history  are also affecting patient's functional outcome.   REHAB POTENTIAL: Good  CLINICAL DECISION MAKING: Evolving/moderate complexity  EVALUATION COMPLEXITY: Moderate   GOALS: Goals reviewed with patient? Yes  SHORT TERM GOALS: Updated 12/10/23   Pt will be independent with HEP.   Baseline: Goal status: MET 12/10/23  2.  Pt will be independent with diaphragmatic breathing and down training activities in order to improve pelvic floor relaxation.  Baseline:  Goal status: IN PROGRESS 12/10/23  3.  Pt will report 25% improvement in vaginal spasm.  Baseline:  Goal status:  IN PROGRESS 12/10/23  4.  Pt will be able to sit for 10 minutes without increase in symptoms.  Baseline:  Goal status:  IN PROGRESS 12/10/23  5.  Pt will begin use of pelvic floor muscle wand in order to reduce pelvic floor muscle tone.  Baseline:  Goal status:  IN PROGRESS 12/10/23   LONG TERM GOALS: Target date: 04/29/24  Pt will be independent with advanced HEP.   Baseline:  Goal status:  IN PROGRESS 12/10/23  2.  Pt will report 75% improvement in vaginal spasm.  Baseline:  Goal status:  IN PROGRESS 12/10/23  3.  Pt will be independent with pelvic floor muscle wand for management of pelvic floor muscle spasm.  Baseline:  Goal status:  IN PROGRESS 12/10/23  4.  Pt will increase pelvic floor muscle strength to 3/5. Baseline:  Goal status:  IN PROGRESS 12/10/23  5.  Pt will demonstrate 25% improvement in lumbar A/ROM. Baseline:  Goal status:  IN PROGRESS 12/10/23  6.  Pt will be able to perform bil single leg stance without pelvic drop in order to demonstrate improved pelvic stability and functional core strength.  Baseline:  Goal status:  IN PROGRESS 12/10/23  PLAN:  PT FREQUENCY:-  PT DURATION: -  PLANNED INTERVENTIONS: -  PLAN FOR NEXT SESSION: -   Verlena Glenn, PT, DPT04/28/252:44 PM  PHYSICAL THERAPY DISCHARGE SUMMARY  Visits from Start of  Care: 8  Current functional level related to goals / functional outcomes: Not met   Remaining deficits: See above   Education / Equipment: HEP   Patient agrees to discharge. Patient goals were not met. Patient is being discharged due to the patient's request.  Verlena Glenn, PT, DPT05/07/253:12 PM

## 2023-12-30 ENCOUNTER — Telehealth: Payer: Self-pay

## 2023-12-30 ENCOUNTER — Ambulatory Visit: Admitting: Internal Medicine

## 2023-12-30 NOTE — Telephone Encounter (Signed)
 Pt called wanting results of pap smear that was done in December 2024. Informed pt that pt results were completely normal.

## 2023-12-30 NOTE — Progress Notes (Deleted)
 Name: Melinda Charles  MRN/ DOB: [id number], [birth year]    Age/ Sex: 58 y.o., female    PCP: Jacqulyne Maxim, MD   Reason for Endocrinology Evaluation: Hyperthyroidism     Date of Initial Endocrinology Evaluation: 06/29/2021    HPI: [name] is a 58 y.o. female with a past medical history of GERD and Hyperthyroidism. The patient presented for initial endocrinology clinic visit on 06/29/2021  for consultative assistance with her Hyperthyroidism.   Transferred care from Dr. Parthenia Bolt with [facility]   She was diagnosed with hyperthyroidism in 09/2018 . This has  been attributed to Graves' disease with a TSI elevated at 4 . This was diagnosed during evaluation  for Vertigo , she was also having panic attacks at the time.     She has been on Methimazole from 2020 , she self discontinued in 2022  Repeat TFTs and clinic were normal, and we opted to hold off on thionamides therapy.  Patient was lost to follow-up until her return to our clinic in 06/2023    No Fh of thyroid  disease   SUBJECTIVE:    Today (12/30/23): Melinda Charles is here for a follow up on hyperthyroidism.    Weight has been trending up gradually over the past 2 years She has neck muscular pain , treated by PCP associated with leg pains  Has noted fatigue  Has occasional palpitations with muscle pain Denies loose stools or diarrhea  Denies hand tremors  No recent anxiety  NO local neck swelling   HISTORY:  Past Medical History:  Past Medical History:  Diagnosis Date   Anxiety    [employer]reesia 06/13/2020   Gastroesophageal reflux disease without esophagitis 09/27/2015   Last Assessment & Plan:  2 month history of symptoms Supplies sent to obtain stool sample for H pylori test Trial H2blocker or PPI/reflux precautions Last Assessment & Plan:  2 month history of symptoms Supplies sent to obtain stool sample for H pylori test Trial H2blocker or PPI/reflux precautions   History of  Helicobacter pylori infection    Hyperthyroidism    Varicose veins of leg with pain 04/01/2014   Last Assessment & Plan:  Generalized achiness related to bilateral varicose veins.  Discussed support stockings, extremity elevation, both of which she has done in the past.  She is interested in procedures to alleviate the pain. Last Assessment & Plan:  Generalized achiness related to bilateral varicose veins.  Discussed support stockings, extremity elevation, both of which she has done in the pa   Past Surgical History:  Past Surgical History:  Procedure Laterality Date   NO PAST SURGERIES      Social History:  reports that she has never smoked. She has never been exposed to tobacco smoke. She has never used smokeless tobacco. She reports that she does not currently use alcohol. She reports that she does not use drugs. Family History: family history includes Diabetes in her maternal grandfather; Emphysema in her father; GER disease in her maternal uncle; Heart disease in her paternal grandfather; Hypertension in her father, maternal grandmother, mother, and paternal grandmother.   HOME MEDICATIONS: Allergies as of 12/30/2023   No Known Allergies      Medication List        Accurate as of Dec 30, 2023  7:02 AM. If you have any questions, ask your nurse or doctor.          albuterol  108 (90 Base) MCG/ACT inhaler Commonly known as: VENTOLIN  HFA Inhale 2  puffs into the lungs every 6 (six) hours as needed for wheezing or shortness of breath.   doxycycline  100 MG capsule Commonly known as: VIBRAMYCIN  Take 1 capsule (100 mg total) by mouth 2 (two) times daily.   estradiol  0.025 MG/24HR Commonly known as: VIVELLE -DOT   ibuprofen 800 MG tablet Commonly known as: ADVIL as needed.   multivitamin tablet Take 1 tablet by mouth daily.   progesterone  100 MG capsule Commonly known as: PROMETRIUM    VITAMIN C PO Take by mouth.   VITAMIN D PO Take by mouth.          REVIEW OF  SYSTEMS: A comprehensive ROS was conducted with the patient and is negative except as per HPI     OBJECTIVE:  VS: There were no vitals taken for this visit.   Wt Readings from Last 3 Encounters:  08/06/23 182 lb (82.6 kg)  06/30/23 180 lb (81.6 kg)  04/10/23 177 lb 6.4 oz (80.5 kg)     EXAM: General: Pt appears well and is in NAD  Neck: General: Supple without adenopathy. Thyroid : Thyroid  size normal.  No goiter or nodules appreciated. No thyroid  bruit.  Lungs: Clear with good BS bilat   Heart: Auscultation: RRR.  Abdomen:  soft, nontender  Extremities:  BL LE: No pretibial edema  Mental Status: Judgment, insight: Intact Orientation: Oriented to time, place, and person Mood and affect: No depression, anxiety, or agitation     DATA REVIEWED:    Latest Reference Range & Units 06/30/23 08:58  TSH 0.35 - 5.50 uIU/mL 3.00  Triiodothyronine,Free,Serum 2.3 - 4.2 pg/mL 3.8  T4,Free(Direct) 0.60 - 1.60 ng/dL 8.33    ASSESSMENT/PLAN/RECOMMENDATIONS:   1. Graves' Disease:   -Patient is clinically euthyroid -No local neck symptoms -Repeat TFTs remain within normal range, no indication for thionamides therapy. -Reassurance provided, patient understands that Graves' disease could relapse at any time and there is no prediction of timing, emphasized the importance of managing stress properly  Follow-up with PCP    Signed electronically by: Natale Bail, MD  [facility] Endocrinology  [facility] [address] [place] [place], [place] [address] Phone: [phone] FAX: [phone]   CC: Jacqulyne Maxim, MD [address] [place] [address] Phone: [phone] Fax: [phone]   Return to Endocrinology clinic as below: Future Appointments  Date Time Provider Department Center  12/30/2023  9:10 AM Tanika Bracco, Julian Obey, MD [facility] None  01/01/2024 11:45 AM Verlena Glenn A, PT [facility] None  01/05/2024 11:45  AM Verlena Glenn A, PT [facility] None  01/08/2024 11:45 AM Verlena Glenn A, PT [facility] None  01/12/2024  2:45 PM Verlena Glenn A, PT [facility] None  01/15/2024 11:45 AM Verlena Glenn A, PT [facility] None  01/26/2024 11:45 AM Verlena Glenn A, PT [facility] None  01/29/2024 11:45 AM Verlena Glenn A, PT [facility] None  02/05/2024 11:45 AM Verlena Glenn A, PT [facility] None  02/09/2024 11:45 AM Verlena Glenn A, PT [facility] None  02/12/2024 11:45 AM Verlena Glenn A, PT [facility] None  02/16/2024 11:45 AM Aline Ireland, PT [facility] None  02/19/2024 11:45 AM Aline Ireland, PT [facility] None

## 2024-01-01 ENCOUNTER — Ambulatory Visit

## 2024-01-02 DIAGNOSIS — M7918 Myalgia, other site: Secondary | ICD-10-CM | POA: Diagnosis not present

## 2024-01-02 DIAGNOSIS — R102 Pelvic and perineal pain: Secondary | ICD-10-CM | POA: Diagnosis not present

## 2024-01-02 DIAGNOSIS — G8929 Other chronic pain: Secondary | ICD-10-CM | POA: Diagnosis not present

## 2024-01-02 LAB — LAB REPORT - SCANNED
Free T4: 1.2 ng/dL
TSH: 0.35 — AB (ref 0.41–5.90)

## 2024-01-05 ENCOUNTER — Encounter

## 2024-01-08 ENCOUNTER — Encounter

## 2024-01-12 ENCOUNTER — Encounter

## 2024-01-14 ENCOUNTER — Ambulatory Visit: Admitting: Internal Medicine

## 2024-01-14 ENCOUNTER — Encounter: Payer: Self-pay | Admitting: Internal Medicine

## 2024-01-14 VITALS — BP 104/66 | HR 57 | Ht 64.0 in | Wt 180.0 lb

## 2024-01-14 DIAGNOSIS — Z8639 Personal history of other endocrine, nutritional and metabolic disease: Secondary | ICD-10-CM | POA: Diagnosis not present

## 2024-01-14 DIAGNOSIS — E0590 Thyrotoxicosis, unspecified without thyrotoxic crisis or storm: Secondary | ICD-10-CM

## 2024-01-14 NOTE — Progress Notes (Signed)
 Name: Melinda Charles  MRN/ DOB: [id number], 23-Oct-1965    Age/ Sex: 58 y.o., female    PCP: Jacqulyne Maxim, MD   Reason for Endocrinology Evaluation: Hyperthyroidism     Date of Initial Endocrinology Evaluation: 06/29/2021    HPI: [name] is a 58 y.o. female with a past medical history of GERD and Hyperthyroidism. The patient presented for initial endocrinology clinic visit on 06/29/2021  for consultative assistance with her Hyperthyroidism.   Transferred care from Dr. Parthenia Bolt with [facility]   She was diagnosed with hyperthyroidism in 09/2018 . This has  been attributed to Graves' disease with a TSI elevated at 4 . This was diagnosed during evaluation  for Vertigo , she was also having panic attacks at the time.     She has been on Methimazole from 2020 , she self discontinued in 2022  Repeat TFTs and clinic were normal, and we opted to hold off on thionamides therapy.  Patient was lost to follow-up until her return to our clinic in 06/2023    No Fh of thyroid  disease   SUBJECTIVE:    Today (01/14/24): Melinda Charles is here for a follow up on hyperthyroidism.   Since her last visit here, she established care with [facility] for HRT and testosterone  therapy and was also started on NP thyroid  at the time .  Her last dose of NP thyroid  was ~ 1 months ago    Since her last visit here, she has been noted with low TSH again through PCP's office at 0.347 uIU/mL on 01/02/2024  Weight has been stable  Has occasional palpitations  She was having pelvic spasms and muscle pains and tightness ,she feels sluggish , has not been able to work out properly  Denies loose stools or diarrhea  No local neck swelling No Biotin     HISTORY:  Past Medical History:  Past Medical History:  Diagnosis Date   Anxiety    Phreesia 06/13/2020   Gastroesophageal reflux disease without esophagitis 09/27/2015   Last Assessment & Plan:  2 month history of symptoms  Supplies sent to obtain stool sample for H pylori test Trial H2blocker or PPI/reflux precautions Last Assessment & Plan:  2 month history of symptoms Supplies sent to obtain stool sample for H pylori test Trial H2blocker or PPI/reflux precautions   History of Helicobacter pylori infection    Hyperthyroidism    Varicose veins of leg with pain 04/01/2014   Last Assessment & Plan:  Generalized achiness related to bilateral varicose veins.  Discussed support stockings, extremity elevation, both of which she has done in the past.  She is interested in procedures to alleviate the pain. Last Assessment & Plan:  Generalized achiness related to bilateral varicose veins.  Discussed support stockings, extremity elevation, both of which she has done in the pa   Past Surgical History:  Past Surgical History:  Procedure Laterality Date   NO PAST SURGERIES      Social History:  reports that she has never smoked. She has never been exposed to tobacco smoke. She has never used smokeless tobacco. She reports that she does not currently use alcohol. She reports that she does not use drugs. Family History: family history includes Diabetes in her maternal grandfather; Emphysema in her father; GER disease in her maternal uncle; Heart disease in her paternal grandfather; Hypertension in her father, maternal grandmother, mother, and paternal grandmother.   HOME MEDICATIONS: Allergies as of 01/14/2024   No Known Allergies  Medication List        Accurate as of Jan 14, 2024 12:03 PM. If you have any questions, ask your nurse or doctor.          albuterol  108 (90 Base) MCG/ACT inhaler Commonly known as: VENTOLIN  HFA Inhale 2 puffs into the lungs every 6 (six) hours as needed for wheezing or shortness of breath.   diazepam 5 MG tablet Commonly known as: VALIUM Take 1 tablet (5 mg total) intravaginal nightly PRN   doxycycline  100 MG capsule Commonly known as: VIBRAMYCIN  Take 1 capsule (100 mg total) by  mouth 2 (two) times daily.   estradiol  0.025 MG/24HR Commonly known as: VIVELLE -DOT   estradiol  0.0375 MG/24HR Commonly known as: VIVELLE -DOT 1 patch 2 (two) times a week.   ibuprofen 800 MG tablet Commonly known as: ADVIL as needed.   multivitamin tablet Take 1 tablet by mouth daily.   NP Thyroid  15 MG tablet Generic drug: thyroid  Take 15 mg by mouth daily.   progesterone  100 MG capsule Commonly known as: PROMETRIUM    VITAMIN C PO Take by mouth.   VITAMIN D PO Take by mouth.          REVIEW OF SYSTEMS: A comprehensive ROS was conducted with the patient and is negative except as per HPI     OBJECTIVE:  VS: BP 104/66 (BP Location: Left Arm, Patient Position: Sitting, Cuff Size: Normal)   Pulse (!) 57   Ht 5\' 4"  (1.626 m)   Wt 180 lb (81.6 kg)   SpO2 99%   BMI 30.90 kg/m     Wt Readings from Last 3 Encounters:  08/06/23 182 lb (82.6 kg)  06/30/23 180 lb (81.6 kg)  04/10/23 177 lb 6.4 oz (80.5 kg)     EXAM: General: Pt appears well and is in NAD  Neck: General: Supple without adenopathy. Thyroid : Thyroid  size normal.  No goiter or nodules appreciated. No thyroid  bruit.  Lungs: Clear with good BS bilat   Heart: Auscultation: RRR.  Abdomen:  soft, nontender  Extremities:  BL LE: No pretibial edema  Mental Status: Judgment, insight: Intact Orientation: Oriented to time, place, and person Mood and affect: No depression, anxiety, or agitation     DATA REVIEWED:    Latest Reference Range & Units 01/14/24 14:05  TSH 0.40 - 4.50 mIU/L 0.02 (L)  Triiodothyronine,Free,Serum 2.3 - 4.2 pg/mL 5.8 (H)  T4,Free(Direct) 0.8 - 1.8 ng/dL 1.9 (H)     Latest Reference Range & Units 06/30/23 08:58  TSH 0.35 - 5.50 uIU/mL 3.00  Triiodothyronine,Free,Serum 2.3 - 4.2 pg/mL 3.8  T4,Free(Direct) 0.60 - 1.60 ng/dL 1.61    ASSESSMENT/PLAN/RECOMMENDATIONS:   1. Graves' Disease:   -Patient continues with chronic musculoskeletal complaints, these have been  present even with normalization of her TFTs -She does endorse fatigue -Patient was started on NP thyroid  through [facility] and was noted to have a low TSH through her PCPs office, the patient has not been on NP thyroid  over a month ago -Repeat TFTs today show suppressed TSH, elevated free T4 and T3, per patient she has discontinued NP thyroid  more than a month ago, and I will assume that this is due to Graves' disease relapse - Will start methimazole as below - Will repeat labs in 4 weeks  Medication  Start methimazole 5 mg daily    F/U in 4 months    Signed electronically by: Natale Bail, MD  [facility]  [facility] [address]  Anice Kerbs [address] [place] [address] Phone: [phone] FAX: [phone]   CC: Jacqulyne Maxim, MD [address] [place] [address] Phone: [phone] Fax: [phone]   Return to Endocrinology clinic as below: Future Appointments  Date Time Provider Department Center  01/14/2024  1:20 PM Rusell Meneely, Julian Obey, MD [facility] None

## 2024-01-15 ENCOUNTER — Encounter

## 2024-01-15 ENCOUNTER — Ambulatory Visit: Payer: Self-pay | Admitting: Internal Medicine

## 2024-01-15 DIAGNOSIS — E0590 Thyrotoxicosis, unspecified without thyrotoxic crisis or storm: Secondary | ICD-10-CM

## 2024-01-15 LAB — T4, FREE: Free T4: 1.9 ng/dL — ABNORMAL HIGH (ref 0.8–1.8)

## 2024-01-15 LAB — T3, FREE: T3, Free: 5.8 pg/mL — ABNORMAL HIGH (ref 2.3–4.2)

## 2024-01-15 LAB — TSH: TSH: 0.02 m[IU]/L — ABNORMAL LOW (ref 0.40–4.50)

## 2024-01-15 MED ORDER — METHIMAZOLE 5 MG PO TABS: 5.0000 mg | ORAL_TABLET | Freq: Every day | ORAL | 3 refills | Status: DC

## 2024-01-15 NOTE — Telephone Encounter (Signed)
 Please let the patient know that her thyroid  levels are worse than what they have been, and I would suggest going back on the methimazole   A prescription of methimazole 5 mg, 1 tablets a day will be sent to the pharmacy   She is already scheduled for repeat labs in a few weeks  Thanks

## 2024-01-23 ENCOUNTER — Telehealth: Payer: Self-pay

## 2024-01-23 NOTE — Telephone Encounter (Signed)
 Patient would like to know which exercises she can do starting on the Methimazole.

## 2024-01-23 NOTE — Telephone Encounter (Signed)
Patient made aware and agrees with plan

## 2024-01-26 ENCOUNTER — Encounter

## 2024-01-29 ENCOUNTER — Encounter

## 2024-02-05 ENCOUNTER — Encounter

## 2024-02-09 ENCOUNTER — Encounter

## 2024-02-09 DIAGNOSIS — E039 Hypothyroidism, unspecified: Secondary | ICD-10-CM | POA: Diagnosis not present

## 2024-02-09 DIAGNOSIS — N951 Menopausal and female climacteric states: Secondary | ICD-10-CM | POA: Diagnosis not present

## 2024-02-10 ENCOUNTER — Telehealth: Payer: Self-pay

## 2024-02-10 LAB — LAB REPORT - SCANNED
Free T4: 1.34 ng/dL
TSH: 0.01 — AB (ref 0.41–5.90)

## 2024-02-10 NOTE — Telephone Encounter (Signed)
 Patient would like to know if it's okay to take Magnesium while on Methimazole ?

## 2024-02-10 NOTE — Telephone Encounter (Signed)
 Patient has been notified and verbalized understanding

## 2024-02-11 DIAGNOSIS — Z6831 Body mass index (BMI) 31.0-31.9, adult: Secondary | ICD-10-CM | POA: Diagnosis not present

## 2024-02-11 DIAGNOSIS — R232 Flushing: Secondary | ICD-10-CM | POA: Diagnosis not present

## 2024-02-11 DIAGNOSIS — R61 Generalized hyperhidrosis: Secondary | ICD-10-CM | POA: Diagnosis not present

## 2024-02-11 DIAGNOSIS — E0590 Thyrotoxicosis, unspecified without thyrotoxic crisis or storm: Secondary | ICD-10-CM | POA: Diagnosis not present

## 2024-02-11 DIAGNOSIS — N951 Menopausal and female climacteric states: Secondary | ICD-10-CM | POA: Diagnosis not present

## 2024-02-12 ENCOUNTER — Telehealth: Payer: Self-pay | Admitting: Internal Medicine

## 2024-02-12 ENCOUNTER — Other Ambulatory Visit

## 2024-02-12 ENCOUNTER — Encounter

## 2024-02-12 MED ORDER — METHIMAZOLE 5 MG PO TABS: 10.0000 mg | ORAL_TABLET | Freq: Every day | ORAL | 3 refills | Status: DC

## 2024-02-12 NOTE — Telephone Encounter (Signed)
 Patient dropped off her TFT results from [facility]  02/10/2024 TSH < 0.0083 Free T41.34 Free T3-4 Anti-TPO less than 3   Patient on methimazole  1 tablet daily, will increase to twice daily

## 2024-02-13 ENCOUNTER — Telehealth: Payer: Self-pay

## 2024-02-13 NOTE — Telephone Encounter (Signed)
 LMTCB for lab reesult. Also advised that mychart message has been sent

## 2024-02-16 ENCOUNTER — Encounter

## 2024-02-19 ENCOUNTER — Encounter

## 2024-03-03 ENCOUNTER — Ambulatory Visit: Admitting: Internal Medicine

## 2024-03-17 DIAGNOSIS — Z1231 Encounter for screening mammogram for malignant neoplasm of breast: Secondary | ICD-10-CM | POA: Diagnosis not present

## 2024-04-13 NOTE — Progress Notes (Deleted)
 Office Visit Note  Patient: Melinda Charles             Date of Birth: [birth year]           MRN: [id number]             PCP: Jolee Madelin Patch, MD Referring: Jolee Madelin Patch, MD Visit Date: 04/27/2024   Subjective:  No chief complaint on file.   History of Present Illness: Melinda Charles is a 58 y.o. female here for follow up after a long interval both mostly the same ongoing problem of pain and stiffness with very tight muscles throughout her upper back shoulders and neck.    Previous HPI 10/23/2022 Melinda Charles is a 58 y.o. female here for follow up after a long interval both mostly the same ongoing problem of pain and stiffness with very tight muscles throughout her upper back shoulders and neck.  She does not recall whether there was ever a large difference with the trial of muscle relaxer medication.  She takes ibuprofen intermittently which is partially helpful.  She has been trying to resume regular physical exercise and attending the gym but having a lot of trouble.  She gets slight relief once up and moving but frequently has exacerbations taking a day or longer to recover if she overexerts herself.  Sometimes with muscle spasms but often just stiff and achiness without any specific trigger.  Does not have much of this affecting lower back and extremities.   Previous HPI 05/07/21 Melinda Charles is a 58 y.o. female here for follow up with some ongoing problems with pain mostly in the neck and upper back.  She continues feeling sometimes lumps or nodules in the affected areas.  She also has headaches and fatigue these are doing somewhat worse as well as the pain and stiffness.  Her symptoms are very distracting sometimes disruptive for sleep and rest due to pain or discomfort.  She had MRI of the cervical spine checked with some mild abnormalities recommendation for orthopedic surgery follow-up to evaluate this.   Previous HPI 09/18/20 Melinda Charles is a 58 y.o. female with a history of hyperthyroidism, anxiety, palpitations here for evaluation of arthralgias especially cervicalgia and upper back muscle pain. These symptoms are ongoing for the past year without any specific infection, medication change, or injuries prior to onset. She has noticed multiple knots or lumps over her back that are tender and have improved sometimes with stretching but often case more pain worse than it started when she tries to exercise. She also has persistent tenderness on the neck. She does not notice much problem in the lower extremities.   She also sometimes experiences dizziness sensation and was evaluated by a specialist for vertigo with no problems found in the ear and vestibular system. She denies significant sleep disorder or bad fatigue except when having pain. She has chronic constipation without any history of complications. She does not have problems with headaches.   She also reports right wrist swelling and pain since a few months ago she thinks it was injured in some fashion but not a typical forward fall. It partially improved but has remained swollen and painful with movement for months.     Labs reviewed 07/2020 ESR 12 CRP <5 RA neg   No Rheumatology ROS completed.   PMFS History:  Patient Active Problem List   Diagnosis Date Noted   Chronic myofascial pain 10/23/2022   Graves disease 06/29/2021   Pain and swelling of right  wrist 09/18/2020   Upper back pain 09/18/2020   Neck pain 07/18/2020   Hyperthyroidism 10/23/2018   Special screening for malignant neoplasms, colon 02/11/2018   History of Helicobacter pylori infection 02/11/2018   Palpitations 01/22/2018   Unsteadiness 12/30/2017   Gastroesophageal reflux disease without esophagitis 09/27/2015   Corn of foot 04/22/2014   Varicose veins of leg with pain 04/01/2014    Past Medical History:  Diagnosis Date   Anxiety    Phreesia 06/13/2020   Gastroesophageal  reflux disease without esophagitis 09/27/2015   Last Assessment & Plan:  2 month history of symptoms Supplies sent to obtain stool sample for H pylori test Trial H2blocker or PPI/reflux precautions Last Assessment & Plan:  2 month history of symptoms Supplies sent to obtain stool sample for H pylori test Trial H2blocker or PPI/reflux precautions   History of Helicobacter pylori infection    Hyperthyroidism    Varicose veins of leg with pain 04/01/2014   Last Assessment & Plan:  Generalized achiness related to bilateral varicose veins.  Discussed support stockings, extremity elevation, both of which she has done in the past.  She is interested in procedures to alleviate the pain. Last Assessment & Plan:  Generalized achiness related to bilateral varicose veins.  Discussed support stockings, extremity elevation, both of which she has done in the pa    Family History  Problem Relation Age of Onset   Hypertension Mother    Emphysema Father    Hypertension Father    Hypertension Maternal Grandmother    Diabetes Maternal Grandfather    Hypertension Paternal Grandmother    Heart disease Paternal Grandfather    GER disease Maternal Uncle    Past Surgical History:  Procedure Laterality Date   NO PAST SURGERIES     Social History   Social History Narrative   Not on file    There is no immunization history on file for this patient.   Objective: Vital Signs: There were no vitals taken for this visit.   Physical Exam   Musculoskeletal Exam: ***  CDAI Exam: CDAI Score: -- Patient Global: --; Provider Global: -- Swollen: --; Tender: -- Joint Exam 04/27/2024   No joint exam has been documented for this visit   There is currently no information documented on the homunculus. Go to the Rheumatology activity and complete the homunculus joint exam.  Investigation: No additional findings.  Imaging: No results found.  Recent Labs: Lab Results  Component Value Date   WBC 6.2 11/24/2019    HGB 12.7 11/24/2019   PLT 267 11/24/2019   NA 141 12/28/2019   K 4.3 12/28/2019   CL 102 12/28/2019   CO2 25 12/28/2019   GLUCOSE 108 (H) 11/24/2019   BUN 13 11/24/2019   CREATININE 0.63 11/24/2019   BILITOT 0.2 11/24/2019   ALKPHOS 72 11/24/2019   AST 18 11/24/2019   ALT 10 11/24/2019   PROT 7.2 11/24/2019   ALBUMIN 4.7 11/24/2019   CALCIUM 9.5 11/24/2019   GFRAA 119 11/24/2019    Speciality Comments: No specialty comments available.  Procedures:  No procedures performed Allergies: Patient has no known allergies.   Assessment / Plan:     Visit Diagnoses: No diagnosis found.  ***  Orders: No orders of the defined types were placed in this encounter.  No orders of the defined types were placed in this encounter.    Follow-Up Instructions: No follow-ups on file.   Judah Carchi M Dashaun Onstott, CMA  Note - This record has been  created using [employer].  Chart creation errors have been sought, but may not always  have been located. Such creation errors do not reflect on  the standard of medical care.

## 2024-04-27 ENCOUNTER — Ambulatory Visit: Admitting: Internal Medicine

## 2024-04-27 DIAGNOSIS — M7918 Myalgia, other site: Secondary | ICD-10-CM

## 2024-05-19 ENCOUNTER — Encounter: Payer: Self-pay | Admitting: Internal Medicine

## 2024-05-19 ENCOUNTER — Ambulatory Visit: Admitting: Internal Medicine

## 2024-05-19 NOTE — Progress Notes (Deleted)
 Name: Melinda Charles  MRN/ DOB: [id number], March 26, 1966    Age/ Sex: 58 y.o., female    PCP: Jolee Madelin Patch, MD   Reason for Endocrinology Evaluation: Hyperthyroidism     Date of Initial Endocrinology Evaluation: 06/29/2021    HPI: Melinda Charles is a 58 y.o. female with a past medical history of GERD and Hyperthyroidism. The patient presented for initial endocrinology clinic visit on 06/29/2021  for consultative assistance with her Hyperthyroidism.   Transferred care from Dr. Raymonde with [facility]   She was diagnosed with hyperthyroidism in 09/2018 . This has  been attributed to Graves' disease with a TSI elevated at 4 . This was diagnosed during evaluation  for Vertigo , she was also having panic attacks at the time.     She has been on Methimazole  from 2020 , she self discontinued in 2022  Repeat TFTs and clinic were normal, and we opted to hold off on thionamides therapy.  Patient was lost to follow-up until her return to our clinic in 06/2023  In the interim she establish care with [facility] clinic for HRT, testosterone  therapy.  She was started on NP thyroid  through the nurse practitioner at the clinic but the patient eventually discontinued this approximately 1 month prior to her presentation to our clinic again in May, 2025  No Fh of thyroid  disease   SUBJECTIVE:    Today (05/19/24): Melinda Charles is here for a follow up on hyperthyroidism.   Patient continues to follow-up with [facility] for HRT and testosterone  therapy  Weight has been stable  Has occasional palpitations  She was having pelvic spasms and muscle pains and tightness ,she feels sluggish , has not been able to work out properly  Denies loose stools or diarrhea  No local neck swelling No Biotin   Methimazole  5 mg, 1 tablet twice daily  HISTORY:  Past Medical History:  Past Medical History:  Diagnosis Date   Anxiety    Phreesia 06/13/2020   Gastroesophageal  reflux disease without esophagitis 09/27/2015   Last Assessment & Plan:  2 month history of symptoms Supplies sent to obtain stool sample for H pylori test Trial H2blocker or PPI/reflux precautions Last Assessment & Plan:  2 month history of symptoms Supplies sent to obtain stool sample for H pylori test Trial H2blocker or PPI/reflux precautions   History of Helicobacter pylori infection    Hyperthyroidism    Varicose veins of leg with pain 04/01/2014   Last Assessment & Plan:  Generalized achiness related to bilateral varicose veins.  Discussed support stockings, extremity elevation, both of which she has done in the past.  She is interested in procedures to alleviate the pain. Last Assessment & Plan:  Generalized achiness related to bilateral varicose veins.  Discussed support stockings, extremity elevation, both of which she has done in the pa   Past Surgical History:  Past Surgical History:  Procedure Laterality Date   NO PAST SURGERIES      Social History:  reports that she has never smoked. She has never been exposed to tobacco smoke. She has never used smokeless tobacco. She reports that she does not currently use alcohol. She reports that she does not use drugs. Family History: family history includes Diabetes in her maternal grandfather; Emphysema in her father; GER disease in her maternal uncle; Heart disease in her paternal grandfather; Hypertension in her father, maternal grandmother, mother, and paternal grandmother.   HOME MEDICATIONS: Allergies as of 05/19/2024  No Known Allergies      Medication List        Accurate as of May 19, 2024  7:31 AM. If you have any questions, ask your nurse or doctor.          albuterol  108 (90 Base) MCG/ACT inhaler Commonly known as: VENTOLIN  HFA Inhale 2 puffs into the lungs every 6 (six) hours as needed for wheezing or shortness of breath.   diazepam 5 MG tablet Commonly known as: VALIUM Take 1 tablet (5 mg total) intravaginal  nightly PRN   EC-RX Testosterone  0.4 % Crea Place onto the skin.   estradiol  0.025 MG/24HR Commonly known as: VIVELLE -DOT   estradiol  0.0375 MG/24HR Commonly known as: VIVELLE -DOT 1 patch 2 (two) times a week.   ibuprofen 800 MG tablet Commonly known as: ADVIL as needed.   methimazole  5 MG tablet Commonly known as: TAPAZOLE  Take 2 tablets (10 mg total) by mouth daily.   multivitamin tablet Take 1 tablet by mouth daily.   progesterone  100 MG capsule Commonly known as: PROMETRIUM    VITAMIN C PO Take by mouth.   VITAMIN D PO Take by mouth.          REVIEW OF SYSTEMS: A comprehensive ROS was conducted with the patient and is negative except as per HPI     OBJECTIVE:  VS: There were no vitals taken for this visit.    Wt Readings from Last 3 Encounters:  01/14/24 180 lb (81.6 kg)  08/06/23 182 lb (82.6 kg)  06/30/23 180 lb (81.6 kg)     EXAM: General: Pt appears well and is in NAD  Neck: General: Supple without adenopathy. Thyroid : Thyroid  size normal.  No goiter or nodules appreciated. No thyroid  bruit.  Lungs: Clear with good BS bilat   Heart: Auscultation: RRR.  Abdomen:  soft, nontender  Extremities:  BL LE: No pretibial edema  Mental Status: Judgment, insight: Intact Orientation: Oriented to time, place, and person Mood and affect: No depression, anxiety, or agitation     DATA REVIEWED:   02/10/2024 from [facility] TSH < 0.0083 Free T41.34 Free T3-4 Anti-TPO less than 3     ASSESSMENT/PLAN/RECOMMENDATIONS:   1. Graves' Disease:   -Patient continues with chronic musculoskeletal complaints, these have been present even with normalization of her TFTs -She does endorse fatigue -Patient was started on NP thyroid  through [facility] and was noted to have a low TSH through her PCPs office, the patient has not been on NP thyroid  over a month ago -Repeat TFTs today show suppressed TSH, elevated free T4 and T3, per patient she has  discontinued NP thyroid  more than a month ago, and I will assume that this is due to Graves' disease relapse - Will start methimazole  as below - Will repeat labs in 4 weeks  Medication  Start methimazole  5 mg daily    F/U in 4 months    Signed electronically by: Stefano Redgie Butts, MD  [facility]  [facility] [address] [place] [place], [place] [address] Phone: [phone] FAX: [phone]   CC: Jolee Madelin Patch, MD [address] [place] [address] Phone: [phone] Fax: [phone]   Return to Endocrinology clinic as below: Future Appointments  Date Time Provider Department Center  05/19/2024 11:10 AM Amalea Ottey, Donell Redgie, MD [facility] None

## 2024-05-24 ENCOUNTER — Telehealth: Payer: Self-pay

## 2024-05-24 ENCOUNTER — Encounter: Payer: Self-pay | Admitting: Internal Medicine

## 2024-05-24 ENCOUNTER — Ambulatory Visit: Admitting: Internal Medicine

## 2024-05-24 NOTE — Progress Notes (Deleted)
 Name: Melinda Charles  MRN/ DOB: [id number], [birth year]    Age/ Sex: 58 y.o., female    PCP: Jolee Madelin Patch, MD   Reason for Endocrinology Evaluation: Hyperthyroidism     Date of Initial Endocrinology Evaluation: 06/29/2021    HPI: [name] is a 58 y.o. female with a past medical history of GERD and Hyperthyroidism. The patient presented for initial endocrinology clinic visit on 06/29/2021  for consultative assistance with her Hyperthyroidism.   Transferred care from Dr. Raymonde with [facility]   She was diagnosed with hyperthyroidism in 09/2018 . This has  been attributed to Graves' disease with a TSI elevated at 4 . This was diagnosed during evaluation  for Vertigo , she was also having panic attacks at the time.     She has been on Methimazole  from 2020 , she self discontinued in 2022  Repeat TFTs and clinic were normal, and we opted to hold off on thionamides therapy.  Patient was lost to follow-up until her return to our clinic in 06/2023  In the interim she establish care with [facility] clinic for HRT, testosterone  therapy.  She was started on NP thyroid  through the nurse practitioner at the clinic but the patient eventually discontinued this approximately 1 month prior to her presentation to our clinic again in May, 2025  No Fh of thyroid  disease   SUBJECTIVE:    Today (05/24/24): Melinda Charles is here for a follow up on hyperthyroidism.   Patient continues to follow-up with [facility] for HRT and testosterone  therapy  Weight has been stable  Has occasional palpitations  She was having pelvic spasms and muscle pains and tightness ,she feels sluggish , has not been able to work out properly  Denies loose stools or diarrhea  No local neck swelling No Biotin   Methimazole  5 mg, 2 tablet daily  HISTORY:  Past Medical History:  Past Medical History:  Diagnosis Date   Anxiety    Phreesia 06/13/2020   Gastroesophageal reflux  disease without esophagitis 09/27/2015   Last Assessment & Plan:  2 month history of symptoms Supplies sent to obtain stool sample for H pylori test Trial H2blocker or PPI/reflux precautions Last Assessment & Plan:  2 month history of symptoms Supplies sent to obtain stool sample for H pylori test Trial H2blocker or PPI/reflux precautions   History of Helicobacter pylori infection    Hyperthyroidism    Varicose veins of leg with pain 04/01/2014   Last Assessment & Plan:  Generalized achiness related to bilateral varicose veins.  Discussed support stockings, extremity elevation, both of which she has done in the past.  She is interested in procedures to alleviate the pain. Last Assessment & Plan:  Generalized achiness related to bilateral varicose veins.  Discussed support stockings, extremity elevation, both of which she has done in the pa   Past Surgical History:  Past Surgical History:  Procedure Laterality Date   NO PAST SURGERIES      Social History:  reports that she has never smoked. She has never been exposed to tobacco smoke. She has never used smokeless tobacco. She reports that she does not currently use alcohol. She reports that she does not use drugs. Family History: family history includes Diabetes in her maternal grandfather; Emphysema in her father; GER disease in her maternal uncle; Heart disease in her paternal grandfather; Hypertension in her father, maternal grandmother, mother, and paternal grandmother.   HOME MEDICATIONS: Allergies as of 05/24/2024  No Known Allergies      Medication List        Accurate as of May 24, 2024  6:59 AM. If you have any questions, ask your nurse or doctor.          albuterol  108 (90 Base) MCG/ACT inhaler Commonly known as: VENTOLIN  HFA Inhale 2 puffs into the lungs every 6 (six) hours as needed for wheezing or shortness of breath.   diazepam 5 MG tablet Commonly known as: VALIUM Take 1 tablet (5 mg total) intravaginal nightly  PRN   EC-RX Testosterone  0.4 % Crea Place onto the skin.   estradiol  0.025 MG/24HR Commonly known as: VIVELLE -DOT   estradiol  0.0375 MG/24HR Commonly known as: VIVELLE -DOT 1 patch 2 (two) times a week.   ibuprofen 800 MG tablet Commonly known as: ADVIL as needed.   methimazole  5 MG tablet Commonly known as: TAPAZOLE  Take 2 tablets (10 mg total) by mouth daily.   multivitamin tablet Take 1 tablet by mouth daily.   progesterone  100 MG capsule Commonly known as: PROMETRIUM    VITAMIN C PO Take by mouth.   VITAMIN D PO Take by mouth.          REVIEW OF SYSTEMS: A comprehensive ROS was conducted with the patient and is negative except as per HPI     OBJECTIVE:  VS: There were no vitals taken for this visit.    Wt Readings from Last 3 Encounters:  01/14/24 180 lb (81.6 kg)  08/06/23 182 lb (82.6 kg)  06/30/23 180 lb (81.6 kg)     EXAM: General: Pt appears well and is in NAD  Neck: General: Supple without adenopathy. Thyroid : Thyroid  size normal.  No goiter or nodules appreciated. No thyroid  bruit.  Lungs: Clear with good BS bilat   Heart: Auscultation: RRR.  Abdomen:  soft, nontender  Extremities:  BL LE: No pretibial edema  Mental Status: Judgment, insight: Intact Orientation: Oriented to time, place, and person Mood and affect: No depression, anxiety, or agitation     DATA REVIEWED:   02/10/2024 from [facility] TSH < 0.0083 Free T41.34 Free T3-4 Anti-TPO less than 3     ASSESSMENT/PLAN/RECOMMENDATIONS:   1. Graves' Disease:   -Patient continues with chronic musculoskeletal complaints, these have been present even with normalization of her TFTs -She does endorse fatigue -Patient was started on NP thyroid  through [facility] and was noted to have a low TSH through her PCPs office, the patient has not been on NP thyroid  over a month ago -Repeat TFTs today show suppressed TSH, elevated free T4 and T3, per patient she has discontinued NP  thyroid  more than a month ago, and I will assume that this is due to Graves' disease relapse - Will start methimazole  as below - Will repeat labs in 4 weeks  Medication   methimazole  5 mg, 2 tabs  daily    F/U in 4 months    Signed electronically by: Stefano Redgie Butts, MD  [facility] Endocrinology  [facility] [address] [place] [place], [place] [address] Phone: [phone] FAX: [phone]   CC: Jolee Madelin Patch, MD [address] [place] [address] Phone: [phone] Fax: [phone]   Return to Endocrinology clinic as below: Future Appointments  Date Time Provider Department Center  05/24/2024  8:10 AM Charleene Callegari, Donell Redgie, MD [id number] None

## 2024-05-24 NOTE — Telephone Encounter (Signed)
 Patient called to reschedule appointment

## 2024-06-10 ENCOUNTER — Encounter: Payer: Self-pay | Admitting: Internal Medicine

## 2024-06-10 ENCOUNTER — Ambulatory Visit: Admitting: Internal Medicine

## 2024-06-10 VITALS — BP 126/74 | HR 69 | Ht 64.0 in | Wt 173.4 lb

## 2024-06-10 DIAGNOSIS — E0500 Thyrotoxicosis with diffuse goiter without thyrotoxic crisis or storm: Secondary | ICD-10-CM

## 2024-06-10 DIAGNOSIS — E0590 Thyrotoxicosis, unspecified without thyrotoxic crisis or storm: Secondary | ICD-10-CM | POA: Diagnosis not present

## 2024-06-10 LAB — TSH: TSH: 5.77 m[IU]/L — ABNORMAL HIGH (ref 0.40–4.50)

## 2024-06-10 LAB — T3, FREE: T3, Free: 2.8 pg/mL (ref 2.3–4.2)

## 2024-06-10 LAB — T4, FREE: Free T4: 0.9 ng/dL (ref 0.8–1.8)

## 2024-06-10 NOTE — Progress Notes (Signed)
 Name: Melinda Charles  MRN/ DOB: [id number], [birth year]    Age/ Sex: 58 y.o., female    PCP: Jolee Madelin Patch, MD   Reason for Endocrinology Evaluation: Hyperthyroidism     Date of Initial Endocrinology Evaluation: 06/29/2021    HPI: [name] is a 58 y.o. female with a past medical history of GERD and Hyperthyroidism. The patient presented for initial endocrinology clinic visit on 06/29/2021  for consultative assistance with her Hyperthyroidism.   Transferred care from Dr. Raymonde with [facility]   She was diagnosed with hyperthyroidism in 09/2018 . This has  been attributed to Graves' disease with a TSI elevated at 4 . This was diagnosed during evaluation  for Vertigo , she was also having panic attacks at the time.     She has been on Methimazole  from 2020 , she self discontinued in 2022  Repeat TFTs and clinic were normal, and we opted to hold off on thionamides therapy.  Patient was lost to follow-up until her return to our clinic in 06/2023  In the interim she establish care with [facility] clinic for HRT, testosterone  therapy.  She was started on NP thyroid  through the nurse practitioner at the clinic but the patient eventually discontinued this approximately 1 month prior to her presentation to our clinic again in May, 2025  No Fh of thyroid  disease   SUBJECTIVE:    Today (06/10/24): Melinda Charles is here for a follow up on hyperthyroidism.   Patient has been no weight loss since her last visit here No local neck swelling  Denies palpitations No changes in bowel movements  No Biotin   Methimazole  5 mg, 2 tablet daily  HISTORY:  Past Medical History:  Past Medical History:  Diagnosis Date   Anxiety    Phreesia 06/13/2020   Gastroesophageal reflux disease without esophagitis 09/27/2015   Last Assessment & Plan:  2 month history of symptoms Supplies sent to obtain stool sample for H pylori test Trial H2blocker or PPI/reflux  precautions Last Assessment & Plan:  2 month history of symptoms Supplies sent to obtain stool sample for H pylori test Trial H2blocker or PPI/reflux precautions   History of Helicobacter pylori infection    Hyperthyroidism    Varicose veins of leg with pain 04/01/2014   Last Assessment & Plan:  Generalized achiness related to bilateral varicose veins.  Discussed support stockings, extremity elevation, both of which she has done in the past.  She is interested in procedures to alleviate the pain. Last Assessment & Plan:  Generalized achiness related to bilateral varicose veins.  Discussed support stockings, extremity elevation, both of which she has done in the pa   Past Surgical History:  Past Surgical History:  Procedure Laterality Date   NO PAST SURGERIES      Social History:  reports that she has never smoked. She has never been exposed to tobacco smoke. She has never used smokeless tobacco. She reports that she does not currently use alcohol. She reports that she does not use drugs. Family History: family history includes Diabetes in her maternal grandfather; Emphysema in her father; GER disease in her maternal uncle; Heart disease in her paternal grandfather; Hypertension in her father, maternal grandmother, mother, and paternal grandmother.   HOME MEDICATIONS: Allergies as of 06/10/2024   No Known Allergies      Medication List        Accurate as of June 10, 2024 11:35 AM. If you have any questions, ask your  nurse or doctor.          albuterol  108 (90 Base) MCG/ACT inhaler Commonly known as: VENTOLIN  HFA Inhale 2 puffs into the lungs every 6 (six) hours as needed for wheezing or shortness of breath.   diazepam 5 MG tablet Commonly known as: VALIUM Take 1 tablet (5 mg total) intravaginal nightly PRN   EC-RX Testosterone  0.4 % Crea Place onto the skin.   estradiol  0.025 MG/24HR Commonly known as: VIVELLE -DOT   estradiol  0.0375 MG/24HR Commonly known as:  VIVELLE -DOT 1 patch 2 (two) times a week.   ibuprofen 800 MG tablet Commonly known as: ADVIL as needed.   methimazole  5 MG tablet Commonly known as: TAPAZOLE  Take 2 tablets (10 mg total) by mouth daily.   multivitamin tablet Take 1 tablet by mouth daily.   progesterone  100 MG capsule Commonly known as: PROMETRIUM    VITAMIN C PO Take by mouth.   VITAMIN D PO Take by mouth.          REVIEW OF SYSTEMS: A comprehensive ROS was conducted with the patient and is negative except as per HPI     OBJECTIVE:  VS: BP 126/74 (BP Location: Left Arm, Patient Position: Sitting, Cuff Size: Normal)   Pulse 69   Ht 5' 4 (1.626 m)   Wt 173 lb 6.4 oz (78.7 kg)   SpO2 98%   BMI 29.76 kg/m     Wt Readings from Last 3 Encounters:  06/10/24 173 lb 6.4 oz (78.7 kg)  01/14/24 180 lb (81.6 kg)  08/06/23 182 lb (82.6 kg)     EXAM: General: Pt appears well and is in NAD  Neck: General: Supple without adenopathy. Thyroid : Thyroid  size normal.  No goiter or nodules appreciated. No thyroid  bruit.  Lungs: Clear with good BS bilat   Heart: Auscultation: RRR.  Abdomen:  soft, nontender  Extremities:  BL LE: No pretibial edema  Mental Status: Judgment, insight: Intact Orientation: Oriented to time, place, and person Mood and affect: No depression, anxiety, or agitation     DATA REVIEWED:      Latest Reference Range & Units 06/10/24 11:46  TSH 0.40 - 4.50 mIU/L 5.77 (H)  Triiodothyronine,Free,Serum 2.3 - 4.2 pg/mL 2.8  T4,Free(Direct) 0.8 - 1.8 ng/dL 0.9    3/82/7974 from [facility] TSH < 0.0083 Free T41.34 Free T3-4 Anti-TPO less than 3   ASSESSMENT/PLAN/RECOMMENDATIONS:   1.  Hyperthyroidism :  - Patient is clinically euthyroid - TSH elevated - Will change methimazole  as below and recheck TFTs in 1 month   Medication  Change methimazole  5 mg, 2 tablets Monday through Thursday and 1 tablet Friday, Saturday, and Sunday   2. Graves' Disease:   - No  extrathyroidal manifestation of Graves' disease    Signed electronically by: Stefano Redgie Butts, MD  [facility] Endocrinology  [facility] [address] [place], [place] [address] Phone: [phone] FAX: [phone]   CC: Jolee Madelin Patch, MD [address] [place] [address] Phone: [phone] Fax: [phone]   Return to Endocrinology clinic as below: No future appointments.

## 2024-06-11 ENCOUNTER — Ambulatory Visit: Payer: Self-pay | Admitting: Internal Medicine

## 2024-06-11 MED ORDER — METHIMAZOLE 5 MG PO TABS: 5.0000 mg | ORAL_TABLET | ORAL | 2 refills | Status: AC

## 2024-06-11 NOTE — Telephone Encounter (Signed)
 Please let the patient know that the current dose of methimazole  is too much for her, she will need to decrease methimazole  as below    Methimazole  2 tablets Monday through Thursday, 1 tablet Friday, Saturday, and Sunday  Schedule for repeat labs in 1 month.

## 2024-06-14 ENCOUNTER — Other Ambulatory Visit: Payer: Self-pay

## 2024-06-14 DIAGNOSIS — E0590 Thyrotoxicosis, unspecified without thyrotoxic crisis or storm: Secondary | ICD-10-CM

## 2024-07-13 DIAGNOSIS — E039 Hypothyroidism, unspecified: Secondary | ICD-10-CM | POA: Diagnosis not present

## 2024-07-13 DIAGNOSIS — N951 Menopausal and female climacteric states: Secondary | ICD-10-CM | POA: Diagnosis not present

## 2024-07-26 DIAGNOSIS — N951 Menopausal and female climacteric states: Secondary | ICD-10-CM | POA: Diagnosis not present

## 2024-07-26 DIAGNOSIS — Z6831 Body mass index (BMI) 31.0-31.9, adult: Secondary | ICD-10-CM | POA: Diagnosis not present

## 2024-07-26 DIAGNOSIS — R232 Flushing: Secondary | ICD-10-CM | POA: Diagnosis not present

## 2024-07-26 DIAGNOSIS — E0590 Thyrotoxicosis, unspecified without thyrotoxic crisis or storm: Secondary | ICD-10-CM | POA: Diagnosis not present
